# Patient Record
Sex: Male | Born: 1940 | Race: White | Hispanic: No | Marital: Married | Smoking: Former smoker
Health system: Southern US, Community
[De-identification: ages and names within clinical notes are randomized; demographics above are authoritative.]

---

## 2013-01-12 ENCOUNTER — Ambulatory Visit: Payer: Self-pay | Admitting: Family Medicine

## 2018-01-06 ENCOUNTER — Other Ambulatory Visit: Payer: Self-pay | Admitting: Family Medicine

## 2018-01-06 ENCOUNTER — Ambulatory Visit
Admission: RE | Admit: 2018-01-06 | Discharge: 2018-01-06 | Disposition: A | Discharge status: Home / Self Care | Payer: Medicare Other | Source: Ambulatory Visit | Attending: Family Medicine | Admitting: Family Medicine

## 2018-01-06 DIAGNOSIS — M7989 Other specified soft tissue disorders: Secondary | ICD-10-CM | POA: Insufficient documentation

## 2018-01-06 DIAGNOSIS — M79604 Pain in right leg: Secondary | ICD-10-CM

## 2019-02-01 ENCOUNTER — Inpatient Hospital Stay: Payer: Medicare Other

## 2019-02-01 ENCOUNTER — Emergency Department: Payer: Medicare Other

## 2019-02-01 DIAGNOSIS — I452 Bifascicular block: Secondary | ICD-10-CM | POA: Diagnosis present

## 2019-02-01 DIAGNOSIS — I480 Paroxysmal atrial fibrillation: Secondary | ICD-10-CM | POA: Diagnosis present

## 2019-02-01 DIAGNOSIS — I4901 Ventricular fibrillation: Secondary | ICD-10-CM | POA: Diagnosis not present

## 2019-02-01 DIAGNOSIS — J9602 Acute respiratory failure with hypercapnia: Secondary | ICD-10-CM | POA: Diagnosis present

## 2019-02-01 DIAGNOSIS — I5023 Acute on chronic systolic (congestive) heart failure: Secondary | ICD-10-CM | POA: Diagnosis present

## 2019-02-01 DIAGNOSIS — Z823 Family history of stroke: Secondary | ICD-10-CM

## 2019-02-01 DIAGNOSIS — G9341 Metabolic encephalopathy: Secondary | ICD-10-CM | POA: Diagnosis present

## 2019-02-01 DIAGNOSIS — N179 Acute kidney failure, unspecified: Secondary | ICD-10-CM | POA: Diagnosis present

## 2019-02-01 DIAGNOSIS — N183 Chronic kidney disease, stage 3 (moderate): Secondary | ICD-10-CM | POA: Diagnosis present

## 2019-02-01 DIAGNOSIS — I214 Non-ST elevation (NSTEMI) myocardial infarction: Secondary | ICD-10-CM | POA: Diagnosis present

## 2019-02-01 DIAGNOSIS — E0590 Thyrotoxicosis, unspecified without thyrotoxic crisis or storm: Secondary | ICD-10-CM | POA: Diagnosis present

## 2019-02-01 DIAGNOSIS — Z7901 Long term (current) use of anticoagulants: Secondary | ICD-10-CM | POA: Diagnosis not present

## 2019-02-01 DIAGNOSIS — I130 Hypertensive heart and chronic kidney disease with heart failure and stage 1 through stage 4 chronic kidney disease, or unspecified chronic kidney disease: Principal | ICD-10-CM | POA: Diagnosis present

## 2019-02-01 DIAGNOSIS — Z01818 Encounter for other preprocedural examination: Secondary | ICD-10-CM

## 2019-02-01 DIAGNOSIS — I5084 End stage heart failure: Secondary | ICD-10-CM | POA: Diagnosis present

## 2019-02-01 DIAGNOSIS — E1122 Type 2 diabetes mellitus with diabetic chronic kidney disease: Secondary | ICD-10-CM | POA: Diagnosis present

## 2019-02-01 DIAGNOSIS — J9601 Acute respiratory failure with hypoxia: Secondary | ICD-10-CM | POA: Diagnosis present

## 2019-02-01 DIAGNOSIS — E041 Nontoxic single thyroid nodule: Secondary | ICD-10-CM | POA: Diagnosis present

## 2019-02-01 DIAGNOSIS — Z66 Do not resuscitate: Secondary | ICD-10-CM | POA: Diagnosis not present

## 2019-02-01 DIAGNOSIS — E872 Acidosis: Secondary | ICD-10-CM | POA: Diagnosis present

## 2019-02-01 DIAGNOSIS — I255 Ischemic cardiomyopathy: Secondary | ICD-10-CM | POA: Diagnosis present

## 2019-02-01 DIAGNOSIS — Z20828 Contact with and (suspected) exposure to other viral communicable diseases: Secondary | ICD-10-CM | POA: Diagnosis present

## 2019-02-01 DIAGNOSIS — I361 Nonrheumatic tricuspid (valve) insufficiency: Secondary | ICD-10-CM | POA: Diagnosis not present

## 2019-02-01 DIAGNOSIS — R570 Cardiogenic shock: Secondary | ICD-10-CM | POA: Diagnosis present

## 2019-02-01 DIAGNOSIS — Z8546 Personal history of malignant neoplasm of prostate: Secondary | ICD-10-CM | POA: Diagnosis not present

## 2019-02-01 DIAGNOSIS — J9600 Acute respiratory failure, unspecified whether with hypoxia or hypercapnia: Secondary | ICD-10-CM

## 2019-02-01 DIAGNOSIS — Z79899 Other long term (current) drug therapy: Secondary | ICD-10-CM

## 2019-02-01 DIAGNOSIS — Z1159 Encounter for screening for other viral diseases: Secondary | ICD-10-CM | POA: Diagnosis not present

## 2019-02-01 DIAGNOSIS — K219 Gastro-esophageal reflux disease without esophagitis: Secondary | ICD-10-CM | POA: Diagnosis present

## 2019-02-01 DIAGNOSIS — I420 Dilated cardiomyopathy: Secondary | ICD-10-CM | POA: Diagnosis present

## 2019-02-01 DIAGNOSIS — E785 Hyperlipidemia, unspecified: Secondary | ICD-10-CM | POA: Diagnosis present

## 2019-02-01 DIAGNOSIS — R9401 Abnormal electroencephalogram [EEG]: Secondary | ICD-10-CM | POA: Diagnosis not present

## 2019-02-01 DIAGNOSIS — Z8249 Family history of ischemic heart disease and other diseases of the circulatory system: Secondary | ICD-10-CM

## 2019-02-01 DIAGNOSIS — I469 Cardiac arrest, cause unspecified: Secondary | ICD-10-CM | POA: Diagnosis present

## 2019-02-01 DIAGNOSIS — Z515 Encounter for palliative care: Secondary | ICD-10-CM | POA: Diagnosis not present

## 2019-02-01 DIAGNOSIS — I4819 Other persistent atrial fibrillation: Secondary | ICD-10-CM | POA: Diagnosis not present

## 2019-02-01 DIAGNOSIS — R0602 Shortness of breath: Secondary | ICD-10-CM

## 2019-02-01 DIAGNOSIS — Z87891 Personal history of nicotine dependence: Secondary | ICD-10-CM

## 2019-02-01 DIAGNOSIS — E1165 Type 2 diabetes mellitus with hyperglycemia: Secondary | ICD-10-CM | POA: Diagnosis not present

## 2019-02-01 DIAGNOSIS — Z0189 Encounter for other specified special examinations: Secondary | ICD-10-CM

## 2019-02-01 DIAGNOSIS — I272 Pulmonary hypertension, unspecified: Secondary | ICD-10-CM | POA: Diagnosis present

## 2019-02-01 DIAGNOSIS — Z452 Encounter for adjustment and management of vascular access device: Secondary | ICD-10-CM

## 2019-02-01 LAB — URINALYSIS, COMPLETE (UACMP) WITH MICROSCOPIC
Bacteria, UA: NONE SEEN
Bilirubin Urine: NEGATIVE
Glucose, UA: NEGATIVE mg/dL
Hgb urine dipstick: NEGATIVE
Ketones, ur: NEGATIVE mg/dL
Leukocytes,Ua: NEGATIVE
Nitrite: NEGATIVE
Protein, ur: 100 mg/dL — AB
Specific Gravity, Urine: 1.019 (ref 1.005–1.030)
pH: 5 (ref 5.0–8.0)

## 2019-02-01 LAB — CBC WITH DIFFERENTIAL/PLATELET
Abs Immature Granulocytes: 0.39 K/uL — ABNORMAL HIGH (ref 0.00–0.07)
Basophils Absolute: 0 K/uL (ref 0.0–0.1)
Basophils Relative: 0 %
Eosinophils Absolute: 0.1 K/uL (ref 0.0–0.5)
Eosinophils Relative: 1 %
HCT: 42.9 % (ref 39.0–52.0)
Hemoglobin: 13.5 g/dL (ref 13.0–17.0)
Immature Granulocytes: 3 %
Lymphocytes Relative: 30 %
Lymphs Abs: 3.9 K/uL (ref 0.7–4.0)
MCH: 27.9 pg (ref 26.0–34.0)
MCHC: 31.5 g/dL (ref 30.0–36.0)
MCV: 88.6 fL (ref 80.0–100.0)
Monocytes Absolute: 0.6 K/uL (ref 0.1–1.0)
Monocytes Relative: 5 %
Neutro Abs: 7.9 K/uL — ABNORMAL HIGH (ref 1.7–7.7)
Neutrophils Relative %: 61 %
Platelets: 233 K/uL (ref 150–400)
RBC: 4.84 MIL/uL (ref 4.22–5.81)
RDW: 13.9 % (ref 11.5–15.5)
WBC: 12.9 K/uL — ABNORMAL HIGH (ref 4.0–10.5)
nRBC: 0 % (ref 0.0–0.2)

## 2019-02-01 LAB — BLOOD GAS, ARTERIAL
Acid-base deficit: 6.2 mmol/L — ABNORMAL HIGH (ref 0.0–2.0)
Acid-base deficit: 8.3 mmol/L — ABNORMAL HIGH (ref 0.0–2.0)
Bicarbonate: 20.7 mmol/L (ref 20.0–28.0)
Bicarbonate: 16.2 mmol/L — ABNORMAL LOW (ref 20.0–28.0)
FIO2: 0.5
FIO2: 0.5
MECHVT: 500 mL
MECHVT: 500 mL
Mechanical Rate: 16
O2 Saturation: 97.5 %
O2 Saturation: 98.4 %
PEEP: 5 cmH2O
PEEP: 5 cmH2O
Patient temperature: 37
Patient temperature: 37
RATE: 16 resp/min
pCO2 arterial: 45 mmHg (ref 32.0–48.0)
pCO2 arterial: 30 mmHg — ABNORMAL LOW (ref 32.0–48.0)
pH, Arterial: 7.27 — ABNORMAL LOW (ref 7.350–7.450)
pH, Arterial: 7.34 — ABNORMAL LOW (ref 7.350–7.450)
pO2, Arterial: 109 mmHg — ABNORMAL HIGH (ref 83.0–108.0)
pO2, Arterial: 119 mmHg — ABNORMAL HIGH (ref 83.0–108.0)

## 2019-02-01 LAB — COMPREHENSIVE METABOLIC PANEL WITH GFR
ALT: 34 U/L (ref 0–44)
AST: 43 U/L — ABNORMAL HIGH (ref 15–41)
Albumin: 3 g/dL — ABNORMAL LOW (ref 3.5–5.0)
Alkaline Phosphatase: 89 U/L (ref 38–126)
Anion gap: 12 (ref 5–15)
BUN: 30 mg/dL — ABNORMAL HIGH (ref 8–23)
CO2: 18 mmol/L — ABNORMAL LOW (ref 22–32)
Calcium: 8.3 mg/dL — ABNORMAL LOW (ref 8.9–10.3)
Chloride: 108 mmol/L (ref 98–111)
Creatinine, Ser: 1.78 mg/dL — ABNORMAL HIGH (ref 0.61–1.24)
GFR calc Af Amer: 41 mL/min — ABNORMAL LOW
GFR calc non Af Amer: 36 mL/min — ABNORMAL LOW
Glucose, Bld: 208 mg/dL — ABNORMAL HIGH (ref 70–99)
Potassium: 3.8 mmol/L (ref 3.5–5.1)
Sodium: 138 mmol/L (ref 135–145)
Total Bilirubin: 1.1 mg/dL (ref 0.3–1.2)
Total Protein: 5.7 g/dL — ABNORMAL LOW (ref 6.5–8.1)

## 2019-02-01 LAB — URINE DRUG SCREEN, QUALITATIVE (ARMC ONLY)
Amphetamines, Ur Screen: NOT DETECTED
Barbiturates, Ur Screen: NOT DETECTED
Benzodiazepine, Ur Scrn: NOT DETECTED
Cannabinoid 50 Ng, Ur ~~LOC~~: NOT DETECTED
Cocaine Metabolite,Ur ~~LOC~~: NOT DETECTED
MDMA (Ecstasy)Ur Screen: NOT DETECTED
Methadone Scn, Ur: NOT DETECTED
Opiate, Ur Screen: NOT DETECTED
Phencyclidine (PCP) Ur S: NOT DETECTED
Tricyclic, Ur Screen: NOT DETECTED

## 2019-02-01 LAB — APTT
aPTT: 32 seconds (ref 24–36)
aPTT: 32 seconds (ref 24–36)

## 2019-02-01 LAB — TROPONIN I
Troponin I: 0.09 ng/mL (ref <0.03)
Troponin I: 0.22 ng/mL (ref <0.03)
Troponin I: 0.3 ng/mL (ref <0.03)

## 2019-02-01 LAB — GLUCOSE, CAPILLARY
Glucose-Capillary: 178 mg/dL — ABNORMAL HIGH (ref 70–99)
Glucose-Capillary: 178 mg/dL — ABNORMAL HIGH (ref 70–99)

## 2019-02-01 LAB — PROTIME-INR
INR: 1.5 — ABNORMAL HIGH (ref 0.8–1.2)
INR: 1.3 — ABNORMAL HIGH (ref 0.8–1.2)
Prothrombin Time: 18.2 s — ABNORMAL HIGH (ref 11.4–15.2)
Prothrombin Time: 16.1 seconds — ABNORMAL HIGH (ref 11.4–15.2)

## 2019-02-01 LAB — SARS CORONAVIRUS 2 BY RT PCR (HOSPITAL ORDER, PERFORMED IN ~~LOC~~ HOSPITAL LAB): SARS Coronavirus 2: NEGATIVE

## 2019-02-01 LAB — LACTIC ACID, PLASMA: Lactic Acid, Venous: 2.6 mmol/L (ref 0.5–1.9)

## 2019-02-01 LAB — PROCALCITONIN: Procalcitonin: 0.38 ng/mL

## 2019-02-01 MED ORDER — IPRATROPIUM-ALBUTEROL 0.5-2.5 (3) MG/3ML IN SOLN: 3.0000 mL | RESPIRATORY_TRACT | Status: DC | PRN

## 2019-02-01 MED ORDER — PANTOPRAZOLE SODIUM 40 MG IV SOLR: 40.0000 mg | Freq: Every day | INTRAVENOUS | Status: DC

## 2019-02-01 MED ORDER — ASPIRIN 300 MG RE SUPP
300.0000 mg | RECTAL | Status: AC
  Administered 2019-02-01: 300 mg via RECTAL
  Filled 2019-02-01: qty 1

## 2019-02-01 MED ORDER — SODIUM CHLORIDE 0.9 % IV BOLUS
1000.0000 mL | Freq: Once | INTRAVENOUS | Status: AC
  Administered 2019-02-01: 1000 mL via INTRAVENOUS

## 2019-02-01 MED ORDER — NOREPINEPHRINE 16 MG/250ML-% IV SOLN
0.0000 ug/min | INTRAVENOUS | Status: DC
  Administered 2019-02-02: 7 ug/min via INTRAVENOUS
  Administered 2019-02-02: 10 ug/min via INTRAVENOUS
  Filled 2019-02-01 (×2): qty 250

## 2019-02-01 MED ORDER — PROPOFOL 1000 MG/100ML IV EMUL
25.0000 ug/kg/min | INTRAVENOUS | Status: DC
  Administered 2019-02-01 – 2019-02-03 (×4): 25 ug/kg/min via INTRAVENOUS
  Administered 2019-02-03: 20 ug/kg/min via INTRAVENOUS
  Filled 2019-02-01 (×6): qty 100

## 2019-02-01 MED ORDER — PROPOFOL 1000 MG/100ML IV EMUL
INTRAVENOUS | Status: AC
  Administered 2019-02-01: 25 ug/kg/min via INTRAVENOUS
  Filled 2019-02-01: qty 100

## 2019-02-01 MED ORDER — FENTANYL 2500MCG IN NS 250ML (10MCG/ML) PREMIX INFUSION
100.0000 ug/h | INTRAVENOUS | Status: DC
  Administered 2019-02-01 – 2019-02-02 (×2): 100 ug/h via INTRAVENOUS
  Administered 2019-02-03: 150 ug/h via INTRAVENOUS
  Administered 2019-02-04: 05:00:00 200 ug/h via INTRAVENOUS
  Filled 2019-02-01 (×3): qty 250

## 2019-02-01 MED ORDER — CHLORHEXIDINE GLUCONATE CLOTH 2 % EX PADS
6.0000 | MEDICATED_PAD | Freq: Every day | CUTANEOUS | Status: DC
  Administered 2019-02-02 – 2019-02-04 (×3): 6 via TOPICAL

## 2019-02-01 MED ORDER — FENTANYL 2500MCG IN NS 250ML (10MCG/ML) PREMIX INFUSION
INTRAVENOUS | Status: AC
  Administered 2019-02-01: 100 ug/h via INTRAVENOUS
  Filled 2019-02-01: qty 250

## 2019-02-01 MED ORDER — SODIUM CHLORIDE 0.9 % IV SOLN
INTRAVENOUS | Status: DC
  Administered 2019-02-01: 21:00:00 via INTRAVENOUS

## 2019-02-01 MED ORDER — PANTOPRAZOLE SODIUM 40 MG IV SOLR
40.0000 mg | Freq: Every day | INTRAVENOUS | Status: DC
  Administered 2019-02-01 – 2019-02-03 (×3): 40 mg via INTRAVENOUS
  Filled 2019-02-01 (×3): qty 40

## 2019-02-01 MED ORDER — FENTANYL BOLUS VIA INFUSION
25.0000 ug | INTRAVENOUS | Status: DC | PRN
  Administered 2019-02-01: 25 ug via INTRAVENOUS
  Filled 2019-02-01: qty 25

## 2019-02-01 MED ORDER — SODIUM BICARBONATE 8.4 % IV SOLN
50.0000 meq | Freq: Once | INTRAVENOUS | Status: AC
  Administered 2019-02-01: 50 meq via INTRAVENOUS
  Filled 2019-02-01: qty 50

## 2019-02-01 MED ORDER — ROCURONIUM BROMIDE 50 MG/5ML IV SOLN
100.0000 mg | Freq: Once | INTRAVENOUS | Status: AC
  Administered 2019-02-01: 100 mg via INTRAVENOUS
  Filled 2019-02-01: qty 10

## 2019-02-01 MED ORDER — NOREPINEPHRINE 4 MG/250ML-% IV SOLN
0.0000 ug/min | INTRAVENOUS | Status: DC
  Administered 2019-02-01: 5 ug/min via INTRAVENOUS
  Filled 2019-02-01: qty 250

## 2019-02-01 MED ORDER — FENTANYL CITRATE (PF) 100 MCG/2ML IJ SOLN: 50.0000 ug | Freq: Once | INTRAMUSCULAR | Status: AC

## 2019-02-01 MED ORDER — INSULIN ASPART 100 UNIT/ML ~~LOC~~ SOLN
0.0000 [IU] | SUBCUTANEOUS | Status: DC
  Administered 2019-02-01: 3 [IU] via SUBCUTANEOUS
  Administered 2019-02-02: 2 [IU] via SUBCUTANEOUS
  Administered 2019-02-02: 3 [IU] via SUBCUTANEOUS
  Administered 2019-02-03 – 2019-02-04 (×3): 2 [IU] via SUBCUTANEOUS
  Filled 2019-02-01 (×3): qty 1

## 2019-02-01 MED ORDER — HEPARIN SODIUM (PORCINE) 5000 UNIT/ML IJ SOLN
5000.0000 [IU] | Freq: Three times a day (TID) | INTRAMUSCULAR | Status: DC
  Administered 2019-02-01 – 2019-02-02 (×3): 5000 [IU] via SUBCUTANEOUS
  Filled 2019-02-01 (×3): qty 1

## 2019-02-01 NOTE — H&P (Addendum)
[facility] - [place] at [facility]   PATIENT NAME: Tracy Fitzpatrick    MR#:  [id number]  DATE OF BIRTH:  1941/06/21  DATE OF ADMISSION:  02/14/2019  PRIMARY CARE PHYSICIAN: Rayetta HumphreyGeorge, Sionne A, MD   REQUESTING/REFERRING PHYSICIAN: Ileana RoupMcshane, James, MD  CHIEF COMPLAINT:   Chief Complaint  Patient presents with  . Loss of Consciousness    HISTORY OF PRESENT ILLNESS:  Tracy Hookehl Gut  is a 78 y.o. male with a known history of atrial fibrillation on Eliquis, CKD stage III, hypertension, hyperlipidemia, GERD, arthritis, prediabetes, prostate cancer and CHF presenting to the ED post witnessed cardiac arrest.  Patient was recently admitted at [facility] with several days of increasing shortness of breath sleep at night and difficulty lying supine.  He was found to be in atrial fibrillation with RVR thought to be due to hyperthyroidism with probable impending storm. Failed TEE/cardioversion on 5/21.  Patient's wife is currently at the bedside, patient has been having increased dyspnea and feeling short of breath especially with lying down since being discharged from the hospital on the 26th.  Patient's wife states he has been sleeping on the recliner every night.  Last night he was noted to be confused and unable to find his way to the bathroom.  Wife report that he woke up this morning and was able to go to his PCP appointment without any problem.  Patient told his wife he was not feeling well and went to sit in the recliner where he became unresponsive.  Patient wife called EMS and attempted CPR while waiting.  On EMS arrival patient was found to be in V. fib arrest they gave him 1 shock, AP x2 with ROCS.He was intubated at the scene and brought to the ED.  On arrival to the ED, blood pressure was 88/4277mm per Hg, pulse rate 133, respiration 33.  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  ABGs shows pH of 7.27, PCO2 45, FiO2 0.5, PO2 109,  bicarb 20.7, WBC 12.9, BUN 30, creatinine 1.78, AST 43, troponin 0.09, SARS Coronavirus negative.  X-ray showed mild cardiac enlargement but no significant pulmonary finding.  Hospitalists were called for admission to the ICU for further monitoring and management.   PAST MEDICAL HISTORY:  History reviewed. No pertinent past medical history.  PAST SURGICAL HISTORY:  History reviewed. No pertinent surgical history.  SOCIAL HISTORY:   Social History   Tobacco Use  . Smoking status: Not on file  Substance Use Topics  . Alcohol use: Not on file    FAMILY HISTORY:  History reviewed. No pertinent family history. Medical History Relation Name Comments  Kidney failure Brother    Osteoarthritis Brother    Coronary Artery Disease (Blocked arteries around heart) Father    High blood pressure (Hypertension) Mother    Hyperlipidemia (Elevated cholesterol) Mother    Stroke Mother    Uterine cancer Mother    Breast cancer Neg Hx    Prostate cancer Neg Hx      DRUG ALLERGIES:   Allergies  Allergen Reactions  . Penicillins Other (See Comments)    Other Reaction: fainting, Other reaction  . Latex Rash    REVIEW OF SYSTEMS:   ROS Unable to obtain from patient as patient is currently intubated  MEDICATIONS AT HOME:   Prior to Admission medications   Medication Sig Start Date End Date Taking? Authorizing Provider  apixaban (ELIQUIS) 5 MG TABS tablet Take  5 mg by mouth 2 (two) times a day. 01/25/19 02/24/19 Yes [provider]  benazepril (LOTENSIN) 20 MG tablet Take 20 mg by mouth daily. 12/17/18  Yes [provider]  dofetilide (TIKOSYN) 250 MCG capsule Take 250 mcg by mouth every 12 (twelve) hours. 01/25/19 01/25/20 Yes [provider]  dorzolamide-timolol (COSOPT) 22.3-6.8 MG/ML ophthalmic solution Place 1 drop into both eyes 2 (two) times daily.   Yes [provider]  finasteride (PROSCAR) 5 MG tablet Take 5 mg by mouth daily.  11/11/18  Yes [provider]  latanoprost (XALATAN) 0.005 % ophthalmic solution Place 1 drop into both eyes at bedtime.   Yes [provider]  methimazole (TAPAZOLE) 10 MG tablet Take 20 mg by mouth 2 (two) times a day. 01/25/19 01/25/20 Yes [provider]  metoprolol succinate (TOPROL-XL) 100 MG 24 hr tablet Take 100 mg by mouth daily. 01/25/19 01/25/20 Yes [provider]  pravastatin (PRAVACHOL) 10 MG tablet TAKE 1 TABLET BY MOUTH EVERY DAY AT NIGHT 12/17/18  Yes [provider]      VITAL SIGNS:  Blood pressure 105/84, pulse 73, temperature 98.5 F (36.9 C), resp. rate (!) 24, SpO2 99 %.  PHYSICAL EXAMINATION:   Physical Exam  GENERAL:  78 y.o.-year-old patient lying in the bed with no acute distress.  EYES: Pupils equal, round, reactive to light and accommodation. No scleral icterus. Extraocular muscles intact.  HEENT: Head atraumatic, normocephalic. Oropharynx and nasopharynx clear.  NECK:  Supple, no jugular venous distention. No thyroid enlargement, no tenderness.  LUNGS: Normal breath sounds bilaterally, no wheezing, rales,rhonchi or crepitation. No use of accessory muscles of respiration.  CARDIOVASCULAR: S1, S2 normal. No murmurs, rubs, or gallops.  ABDOMEN: Soft, nontender, nondistended. Bowel sounds present. No organomegaly or mass.  EXTREMITIES: No pedal edema, cyanosis, or clubbing.  NEUROLOGIC: Patient is intubated. Mental Status: Patient does not respond to verbal stimuli.  Does not respond to deep sternal rub.  Does not follow commands.  No verbalizations noted.  Cranial Nerves: II: patient does not respond confrontation bilaterally, pupils right 3 mm, left 3 mm,and NONREACTIVE bilaterally III,IV,VI: doll's response absent bilaterally.  V,VII: corneal reflex present bilaterally  VIII: patient does not respond to verbal stimuli IX,X: gag reflex absent, XI: trapezius strength unable to test bilaterally XII: tongue strength  unable to test Motor: Extremities flaccid throughout.  No spontaneous movement noted.  No purposeful movements noted. Sensory: Does not respond to noxious stimuli in any extremity. Deep Tendon Reflexes:  Absent throughout. Plantars: absent bilaterally Cerebellar: Unable to perform PSYCHIATRIC:   SKIN: No obvious rash, lesion, or ulcer.   DATA REVIEWED:  LABORATORY PANEL:   CBC Recent Labs  Lab 03/03/2019 1743  WBC 12.9*  HGB 13.5  HCT 42.9  PLT 233   ------------------------------------------------------------------------------------------------------------------  Chemistries  Recent Labs  Lab Mar 03, 2019 1743  NA 138  K 3.8  CL 108  CO2 18*  GLUCOSE 208*  BUN 30*  CREATININE 1.78*  CALCIUM 8.3*  AST 43*  ALT 34  ALKPHOS 89  BILITOT 1.1   ------------------------------------------------------------------------------------------------------------------  Cardiac Enzymes Recent Labs  Lab 03-03-2019 1743  TROPONINI 0.09*   ------------------------------------------------------------------------------------------------------------------  RADIOLOGY:  Dg Abdomen 1 View  Result Date: 2019-03-03 CLINICAL DATA:  NG tube placement. EXAM: ABDOMEN - 1 VIEW COMPARISON:  None. FINDINGS: External pacer paddles are noted. The NG tube tip is in the midesophagus and needs to be advanced several cm. Scattered air in the small bowel and colon. No obvious free air.  IMPRESSION: The NG tube is in the mid esophagus and should be advanced several cm. Electronically Signed   By: Rudie Meyer M.D.   On: 02/24/2019 18:40   Dg Chest Portable 1 View  Result Date: 02/07/2019 CLINICAL DATA:  Intubation. EXAM: PORTABLE CHEST 1 VIEW COMPARISON:  None. FINDINGS: The heart is mildly enlarged. The mediastinal and hilar contours are within normal limits. The endotracheal tube is 5.3 cm above the carina. The NG tube is in the mid esophagus and needs to be advanced several cm. The lungs are clear of  acute process. No infiltrates or effusions. No pneumothorax. The bony thorax is intact. IMPRESSION: Mild cardiac enlargement but no significant pulmonary findings. The ET tube is 5.3 cm above the carina. The NG tube is in the mid esophagus and should be advanced several cm. Electronically Signed   By: Rudie Meyer M.D.   On: 02/27/2019 18:42    EKG:  EKG: sinus tachycardia, RBBB. Vent. rate 131 BPM PR interval * ms QRS duration 132 ms QT/QTc 351/519 ms P-R-T axes -56 267 58  IMPRESSION AND PLAN:   78 y.o. male with a known history of atrial fibrillation on Eliquis, CKD stage III, hypertension, hyperlipidemia, GERD, arthritis, prediabetes, prostate cancer and CHF presenting to the ED post witnessed cardiac arrest.  1.  Cardiac arrest  - patient presents with witnessed V. fib arrest post ACLS with ROSC  - Admit to ICU - Mechanical ventilation - Obtain CT head - Management per ICU team  2. Elevated troponins - Status post cardiac arrest and ACLS - Trending troponin   3. Systolic Congestive Heart Failure   Last Echo 01/18/19 EF 15% - Repeat Echo post cardiac arrest - On metoprolol and Lotensin held for low BP  3. 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 Atrial fibrillation -anticoagulated on Eliquis - Holding Eliquis pending CT head.  Restart if CT head shows no bleed  5. Hypertension -now hypotensive in the setting of cardiac arrest - Holding all home BP meds  6. CKD (chronic kidney disease) stage 3, GFR 30-59 ml/min  - Hold nephrotoxins -Follow labs in am  All the records are reviewed and case discussed with ED provider. Management plans discussed with the patient, family and they are in agreement.  CODE STATUS: FULL  TOTAL TIME TAKING CARE OF THIS PATIENT: 50 minutes.    on 05-Feb-2019 at 7:44 PM  This patient was staffed with Dr. Orpha Bur, [facility] who personally  evaluated patient, reviewed documentation and agreed with assessment and plan of care as above.  Webb Silversmith, DNP, FNP-BC [facility] Nurse Practitioner Between 7am to 6pm - Pager [phone]  After 6pm go to [email or web address] - password [email or web address]  [facility] [place] [facility]  Office  [phone]  CC: Primary care physician; Rayetta Humphrey, MD

## 2019-02-01 NOTE — ED Notes (Signed)
X-ray at bedside

## 2019-02-01 NOTE — ED Triage Notes (Addendum)
Pt here via EMS from home after witnessed cardiac arrest while sitting on the porch. Per Ems family stated pt gasped and then lost consciousness. Pt brought in with king airway. Found in V-fib when EMS arrived, shocked x 1, 2mg  epi total with EMS. Total down time 5 mins. 18 gauge left EJ by EMS, 18 gauge left AC by EMS. 1000 NS bolus given by EMS.

## 2019-02-01 NOTE — ED Notes (Signed)
Pt assessed, family present. Pt calm on vent. Pulses present.

## 2019-02-01 NOTE — Procedures (Signed)
Central Venous Catheter Insertion Procedure Note Tracy Fitzpatrick [id number] 07-05-1941  Procedure: Insertion of Central Venous Catheter Indications: Assessment of intravascular volume, Drug and/or fluid administration and Frequent blood sampling  Procedure Details Consent: Risks of procedure as well as the alternatives and risks of each were explained to the (patient/caregiver).  Consent for procedure obtained. Time Out: Verified patient identification, verified procedure, site/side was marked, verified correct patient position, special equipment/implants available, medications/allergies/relevent history reviewed, required imaging and test results available.  Performed  Maximum sterile technique was used including antiseptics, cap, gloves, gown, hand hygiene, mask and sheet. Skin prep: Chlorhexidine; local anesthetic administered A antimicrobial bonded/coated triple lumen catheter was placed in the right internal jugular vein using the Seldinger technique.  Evaluation Blood flow good Complications: No apparent complications Patient did tolerate procedure well. Chest X-ray ordered to verify placement.  CXR: pending.  Ultrasound was used for direct visualization of cannulization of Right IJ. Line was secured at the 17 cm mark.     Harlon Ditty, AGACNP-BC [facility] Pager: [phone] Cell: [phone]  Judithe Modest 02-08-19, 10:33 PM

## 2019-02-01 NOTE — Procedures (Signed)
Arterial Catheter Insertion Procedure Note Tracy Fitzpatrick [id number] 08-03-1941  Procedure: Insertion of Arterial Catheter  Indications: Blood pressure monitoring and Frequent blood sampling  Procedure Details Consent: Risks of procedure as well as the alternatives and risks of each were explained to the (patient/caregiver).  Consent for procedure obtained. Time Out: Verified patient identification, verified procedure, site/side was marked, verified correct patient position, special equipment/implants available, medications/allergies/relevent history reviewed, required imaging and test results available.  Performed  Maximum sterile technique was used including antiseptics, cap, gloves, gown, hand hygiene, mask and sheet. Skin prep: Chlorhexidine; local anesthetic administered 20 gauge catheter was inserted into left femoral artery using the Seldinger technique. ULTRASOUND GUIDANCE USED: YES Evaluation Blood flow good; BP tracing good. Complications: No apparent complications.   Harlon Ditty, AGACNP-BC [facility] Pager: [phone] Cell: [phone]  Judithe Modest 02/14/19

## 2019-02-01 NOTE — ED Notes (Signed)
ED TO INPATIENT HANDOFF REPORT  ED Nurse Name and Phone #: Berline Lopes [phone]  S Name/Age/Gender Tracy Fitzpatrick 78 y.o. male Room/Bed: ED10A/ED10A  Code Status   Code Status: Full Code  Home/SNF/Other Home Is this baseline?   Triage Complete: Triage complete  Chief Complaint Respiratory  Triage Note Pt here via EMS from home after witnessed cardiac arrest while sitting on the porch. Per Ems family stated pt gasped and then lost consciousness. Pt brought in with king airway. Found in V-fib when EMS arrived, shocked x 1, 2mg  epi total with EMS. Total down time 5 mins. 18 gauge left EJ by EMS, 18 gauge left AC by EMS. 1000 NS bolus given by EMS.    Allergies Allergies  Allergen Reactions  . Penicillins Other (See Comments)    Other Reaction: fainting, Other reaction  . Latex Rash    Level of Care/Admitting Diagnosis ED Disposition    ED Disposition Condition Comment   Admit  Hospital Area: [facility] [[id number]]  Level of Care: ICU [6]  Covid Evaluation: Person Under Investigation (PUI)  Isolation Risk Level: Low Risk/Droplet (Less than 4L [place] supplementation)  Diagnosis: Cardiac arrest (HCC) [427.5.ICD-9-CM]  Admitting Physician: Webb Silversmith [facility] [[id number]]  Attending Physician: Willadean Carol DODD [[id number]]  Estimated length of stay: past midnight tomorrow  Certification:: I certify this patient will need inpatient services for at least 2 midnights  PT Class (Do Not Modify): Inpatient [101]  PT Acc Code (Do Not Modify): Private [1]       B Medical/Surgery History History reviewed. No pertinent past medical history. History reviewed. No pertinent surgical history.   A IV Location/Drains/Wounds Patient Lines/Drains/Airways Status   Active Line/Drains/Airways    Name:   Placement date:   Placement time:   Site:   Days:   Peripheral IV 02/14/2019 Left External jugular   02-14-2019    1728    External jugular   less than 1   Peripheral IV 2019-02-14  Right Antecubital   2019/02/14    1750    Antecubital   less than 1   Peripheral IV Feb 14, 2019 Left Antecubital   02/14/2019    1731    Antecubital   less than 1   NG/OG Tube Orogastric 18 Fr. Center mouth Aucultation;Xray   02/14/2019    1753    Center mouth   less than 1   Urethral Catheter vet Double-lumen   2019/02/14    1752    Double-lumen   less than 1   Airway 7.5 mm   February 14, 2019    1745     less than 1          Intake/Output Last 24 hours No intake or output data in the 24 hours ending February 14, 2019 1928  Labs/Imaging Results for orders placed or performed during the hospital encounter of 14-Feb-2019 (from the past 48 hour(s))  Glucose, capillary     Status: Abnormal   Collection Time: 2019-02-14  5:42 PM  Result Value Ref Range   Glucose-Capillary 178 (H) 70 - 99 mg/dL  CBC with Differential     Status: Abnormal   Collection Time: 02/14/2019  5:43 PM  Result Value Ref Range   WBC 12.9 (H) 4.0 - 10.5 K/uL   RBC 4.84 4.22 - 5.81 MIL/uL   Hemoglobin 13.5 13.0 - 17.0 g/dL   HCT 93.5 52.1 - 74.7 %   MCV 88.6 80.0 - 100.0 fL   MCH 27.9 26.0 - 34.0 pg   MCHC  31.5 30.0 - 36.0 g/dL   RDW 16.113.9 09.611.5 - 04.515.5 %   Platelets 233 150 - 400 K/uL   nRBC 0.0 0.0 - 0.2 %   Neutrophils Relative % 61 %   Neutro Abs 7.9 (H) 1.7 - 7.7 K/uL   Lymphocytes Relative 30 %   Lymphs Abs 3.9 0.7 - 4.0 K/uL   Monocytes Relative 5 %   Monocytes Absolute 0.6 0.1 - 1.0 K/uL   Eosinophils Relative 1 %   Eosinophils Absolute 0.1 0.0 - 0.5 K/uL   Basophils Relative 0 %   Basophils Absolute 0.0 0.0 - 0.1 K/uL   Immature Granulocytes 3 %   Abs Immature Granulocytes 0.39 (H) 0.00 - 0.07 K/uL    Comment: Performed at [facility], [address] [place], [place] [address]  Comprehensive metabolic panel     Status: Abnormal   Collection Time: 04/23/2019  5:43 PM  Result Value Ref Range   Sodium 138 135 - 145 mmol/L   Potassium 3.8 3.5 - 5.1 mmol/L   Chloride 108 98 - 111 mmol/L   CO2 18 (L) 22 - 32 mmol/L    Glucose, Bld 208 (H) 70 - 99 mg/dL   BUN 30 (H) 8 - 23 mg/dL   Creatinine, Ser 1.911.78 (H) 0.61 - 1.24 mg/dL   Calcium 8.3 (L) 8.9 - 10.3 mg/dL   Total Protein 5.7 (L) 6.5 - 8.1 g/dL   Albumin 3.0 (L) 3.5 - 5.0 g/dL   AST 43 (H) 15 - 41 U/L   ALT 34 0 - 44 U/L   Alkaline Phosphatase 89 38 - 126 U/L   Total Bilirubin 1.1 0.3 - 1.2 mg/dL   GFR calc non Af Amer 36 (L) >60 mL/min   GFR calc Af Amer 41 (L) >60 mL/min   Anion gap 12 5 - 15    Comment: Performed at [facility], [address] [place], [place] [address]  Troponin I - ONCE - STAT     Status: Abnormal   Collection Time: 04/23/2019  5:43 PM  Result Value Ref Range   Troponin I 0.09 (HH) <0.03 ng/mL    Comment: CRITICAL RESULT CALLED TO, READ BACK BY AND VERIFIED WITH VAL CHANDLER @1857  02/11/2019 MJU Performed at [facility], [address] [place], [place] [address]   SARS Coronavirus 2 (CEPHEID - Performed in [facility] hospital lab), Hosp Order     Status: None   Collection Time: 04/23/2019  5:58 PM  Result Value Ref Range   SARS Coronavirus 2 NEGATIVE NEGATIVE    Comment: (NOTE) If result is NEGATIVE SARS-CoV-2 target nucleic acids are NOT DETECTED. The SARS-CoV-2 RNA is generally detectable in upper and lower  respiratory specimens during the acute phase of infection. The lowest  concentration of SARS-CoV-2 viral copies this assay can detect is 250  copies / mL. A negative result does not preclude SARS-CoV-2 infection  and should not be used as the sole basis for treatment or other  patient management decisions.  A negative result may occur with  improper specimen collection / handling, submission of specimen other  than nasopharyngeal swab, presence of viral mutation(s) within the  areas targeted by this assay, and inadequate number of viral copies  (<250 copies / mL). A negative result must be combined with clinical  observations, patient history, and epidemiological information. If result is  POSITIVE SARS-CoV-2 target nucleic acids are DETECTED. The SARS-CoV-2 RNA is generally detectable in upper and lower  respiratory  specimens dur ing the acute phase of infection.  Positive  results are indicative of active infection with SARS-CoV-2.  Clinical  correlation with patient history and other diagnostic information is  necessary to determine patient infection status.  Positive results do  not rule out bacterial infection or co-infection with other viruses. If result is PRESUMPTIVE POSTIVE SARS-CoV-2 nucleic acids MAY BE PRESENT.   A presumptive positive result was obtained on the submitted specimen  and confirmed on repeat testing.  While 2019 novel coronavirus  (SARS-CoV-2) nucleic acids may be present in the submitted sample  additional confirmatory testing may be necessary for epidemiological  and / or clinical management purposes  to differentiate between  SARS-CoV-2 and other Sarbecovirus currently known to infect humans.  If clinically indicated additional testing with an alternate test  methodology [phone]) is advised. The SARS-CoV-2 RNA is generally  detectable in upper and lower respiratory sp ecimens during the acute  phase of infection. The expected result is Negative. Fact Sheet for Patients:  BoilerBrush.com.cy Fact Sheet for Healthcare Providers: https://pope.com/ This test is not yet approved or cleared by the [place] FDA and has been authorized for detection and/or diagnosis of SARS-CoV-2 by FDA under an Emergency Use Authorization (EUA).  This EUA will remain in effect (meaning this test can be used) for the duration of the COVID-19 declaration under Section 564(b)(1) of the Act, 21 U.S.C. section 360bbb-3(b)(1), unless the authorization is terminated or revoked sooner. Performed at [facility], [address] [place], [place] [address]   Protime-INR     Status: Abnormal   Collection Time:  19-Feb-2019  6:30 PM  Result Value Ref Range   Prothrombin Time 18.2 (H) 11.4 - 15.2 seconds   INR 1.5 (H) 0.8 - 1.2    Comment: (NOTE) INR goal varies based on device and disease states. Performed at [facility], [address] [place], [place] [address]   APTT     Status: None   Collection Time: 2019-02-19  6:30 PM  Result Value Ref Range   aPTT 32 24 - 36 seconds    Comment: Performed at [facility], [address] [place], [place] [address]  Blood gas, arterial     Status: Abnormal   Collection Time: Feb 19, 2019  7:20 PM  Result Value Ref Range   FIO2 0.50    Delivery systems VENTILATOR    Mode ASSIST CONTROL    VT 500 mL   Peep/cpap 5.0 cm H20   pH, Arterial 7.27 (L) 7.350 - 7.450   pCO2 arterial 45 32.0 - 48.0 mmHg   pO2, Arterial 109 (H) 83.0 - 108.0 mmHg   Bicarbonate 20.7 20.0 - 28.0 mmol/L   Acid-base deficit 6.2 (H) 0.0 - 2.0 mmol/L   O2 Saturation 97.5 %   Patient temperature 37.0    Collection site RIGHT RADIAL    Sample type ARTERIAL DRAW    Allens test (pass/fail) PASS PASS   Mechanical Rate 16     Comment: Performed at [facility], [address] [place], [place] [address]   Dg Abdomen 1 View  Result Date: 02-19-19 CLINICAL DATA:  NG tube placement. EXAM: ABDOMEN - 1 VIEW COMPARISON:  None. FINDINGS: External pacer paddles are noted. The NG tube tip is in the midesophagus and needs to be advanced several cm. Scattered air in the small bowel and colon. No obvious free air. IMPRESSION: The NG tube is in the mid esophagus and should be advanced several cm. Electronically  Signed   By: Rudie Meyer M.D.   On: 02/14/2019 18:40   Dg Chest Portable 1 View  Result Date: 01/31/2019 CLINICAL DATA:  Intubation. EXAM: PORTABLE CHEST 1 VIEW COMPARISON:  None. FINDINGS: The heart is mildly enlarged. The mediastinal and hilar contours are within normal limits. The endotracheal tube is 5.3 cm above the carina. The NG tube is in the mid esophagus  and needs to be advanced several cm. The lungs are clear of acute process. No infiltrates or effusions. No pneumothorax. The bony thorax is intact. IMPRESSION: Mild cardiac enlargement but no significant pulmonary findings. The ET tube is 5.3 cm above the carina. The NG tube is in the mid esophagus and should be advanced several cm. Electronically Signed   By: Rudie Meyer M.D.   On: 02/10/2019 18:42    Pending Labs Unresulted Labs (From admission, onward)    Start     Ordered   02/26/2019 1904  Troponin I - Now Then Q6H  Now then every 6 hours,   STAT     02/06/2019 1903   02/11/2019 1818  Thyroid Panel With TSH  Add-on,   AD     02/11/2019 1817          Vitals/Pain Today's Vitals   02/02/2019 1818 02/20/2019 1830 02/16/2019 1845 02/04/2019 1900  BP:  (!) 84/69  105/84  Pulse:  63 63 73  Resp:  (!) 21 (!) 21 (!) 24  Temp:  98.5 F (36.9 C) 98.4 F (36.9 C) 98.5 F (36.9 C)  TempSrc:  Core (Comment)    SpO2: 96% 96% 97% 99%    Isolation Precautions No active isolations  Medications Medications  sodium chloride 0.9 % bolus 1,000 mL (0 mLs Intravenous Stopped 02/04/2019 1755)  rocuronium (ZEMURON) injection 100 mg (100 mg Intravenous Given 02/08/2019 1745)    Mobility non-ambulatory High fall risk   Focused Assessments Neuro Assessment Handoff:  Swallow screen pass?  Cardiac Rhythm: Bundle branch block, Normal sinus rhythm       Neuro Assessment: Exceptions to WDL Neuro Checks:      Last Documented NIHSS Modified Score:   Has TPA been given? No If patient is a Neuro Trauma and patient is going to OR before floor call report to 4N Charge nurse: [phone] or [phone]     R Recommendations: See Admitting Provider Note  Report given to:   Additional Notes:

## 2019-02-01 NOTE — Progress Notes (Signed)
Updated pt's wife and his daughter Lurena Joiner that pt remains unresponsive on arrival to ICU and that CT head is negative.  Discussed with them given his continued unresponsiveness, would like to place him on hypothermia protocol, of which they are in agreement with.  They also give consent for Central Line and Arterial Line if needed for further treatment.    Harlon Ditty, AGACNP-BC [facility] Pager: [phone] Cell: [phone]

## 2019-02-01 NOTE — Progress Notes (Signed)
ETT was advanced 2cm per MD order. ETT is now secured 29cm at the lip. BBS auscultated.

## 2019-02-01 NOTE — Progress Notes (Signed)
Family Meeting Note  Advance Directive:no  Today a meeting took place with the family.  Patient is unable to participate due EX:NTZGYF capacity Sedated   The following clinical team members were present during this meeting:MD  The following were discussed:Patient's diagnosis: cardiac arrest, Patient's progosis: Unable to determine and Goals for treatment: Full Code  Additional follow-up to be provided: prn  Time spent during discussion:20 minutes  Hilton Sinclair, MD

## 2019-02-01 NOTE — Progress Notes (Signed)
eLink Physician-Brief Progress Note Patient Name: NICKOLAOS BOGARD DOB: 1941-02-02 MRN: [id number]   Date of Service  2019-02-22  HPI/Events of Note  78 y.o. male with a known history of atrial fibrillation on Eliquis, CKD stage III, hypertension, hyperlipidemia, GERD, arthritis, prediabetes, prostate cancer and CHF presenting to the ED post witnessed cardiac arrest. Now intubated and ventilated and on a Norepinephrine IV infusion. PCCM asked to assume care in ICU. VSS.  eICU Interventions  No new orders.      Intervention Category Evaluation Type: New Patient Evaluation  Lenell Antu 02/22/19, 9:31 PM

## 2019-02-01 NOTE — ED Notes (Signed)
Pt remains to have palpable radial pulses

## 2019-02-01 NOTE — ED Notes (Signed)
ETT advanced 3 cm by RT

## 2019-02-01 NOTE — ED Provider Notes (Addendum)
[facility] Emergency Department Provider Note  ____________________________________________   I have reviewed the triage vital signs and the nursing notes. Where available I have reviewed prior notes and, if possible and indicated, outside hospital notes.    HISTORY  Chief Complaint Loss of Consciousness    HPI Tracy Fitzpatrick is a 78 y.o. male  Patient seen and evaluated during the coronavirus epidemic during a time with low staffing patient is a him with significant CHF history on Eliquis, also history of atrial fibrillation recent hospitalization at [facility], history as per patient family and EMS patient self cannot give a history.  An EF of approximately 20% during his last hospitalization.  According to his wife he has been having increased dyspnea and feeling short of breath without fever or cough, he has been having increased orthopnea since his discharge from the hospital on the 26th.  He stated he did not feel very well today but he was doing all right and then he put his head back in the chair and passed out.  Did not fall, his wife noted that he was completely out of it, she called 911 and she had CPR.  EMS found him with a normal blood sugar, they found him in V. fib arrest they gave him 1 shock, epi x2 and sent him to the hospital here.  ROSC.  He was brought in with a King airway in place, brief downtime of less than 5 to 10 minutes estimated by EMS with bystander CPR.  Family made aware of all findings since his arrival.   Level 5 chart caveat; no further history available due to patient status.  Prior to Admission medications   Medication Sig Start Date End Date Taking? Authorizing Provider  apixaban (ELIQUIS) 5 MG TABS tablet Take 5 mg by mouth 2 (two) times a day. 01/25/19 02/24/19 Yes [provider]  dofetilide (TIKOSYN) 250 MCG capsule Take 250 mcg by mouth every 12 (twelve) hours. 01/25/19 01/25/20 Yes [provider]   methimazole (TAPAZOLE) 10 MG tablet Take 20 mg by mouth 2 (two) times a day. 01/25/19 01/25/20 Yes [provider]  metoprolol succinate (TOPROL-XL) 100 MG 24 hr tablet Take 100 mg by mouth daily. 01/25/19 01/25/20 Yes [provider]  amLODipine (NORVASC) 10 MG tablet Take 10 mg by mouth daily. 11/21/18   [provider]  benazepril (LOTENSIN) 20 MG tablet Take 20 mg by mouth daily. 12/17/18   [provider]  finasteride (PROSCAR) 5 MG tablet Take 5 mg by mouth daily. 11/11/18   [provider]  pravastatin (PRAVACHOL) 10 MG tablet TAKE 1 TABLET BY MOUTH EVERY DAY AT NIGHT 12/17/18   [provider]    Allergies Patient has no allergy information on record.  History reviewed. No pertinent family history.  Social History Social History   Tobacco Use  . Smoking status: Not on file  Substance Use Topics  . Alcohol use: Not on file  . Drug use: Not on file    Review of Systems Constitutional: No fever/chills Eyes: No visual changes. ENT: No sore throat. No stiff neck no neck pain Cardiovascular: Denies chest pain. Respiratory: Denies shortness of breath. Gastrointestinal:   no vomiting.  No diarrhea.  No constipation. Genitourinary: Negative for dysuria. Musculoskeletal: Negative lower extremity swelling Skin: Negative for rash. Neurological: Negative for severe headaches, focal weakness or numbness.   ____________________________________________   PHYSICAL EXAM:  VITAL SIGNS: ED Triage Vitals [03/01/2019 1744]  Enc Vitals  Group     BP (!) 88/77     Pulse Rate (!) 133     Resp (!) 33     Temp      Temp src      SpO2 98 %     Weight      Height      Head Circumference      Peak Flow      Pain Score      Pain Loc      Pain Edu?      Excl. in GC?     Constitutional: Sponsor but breathing on his own, Eyes: Conjunctivae are normal.  As are small and minimally reactive Head: Atraumatic HEENT: No  congestion/rhinnorhea. Mucous membranes are moist.  Oropharynx non-erythematous Neck:   Nontender with no meningismus, no masses, no stridor Cardiovascular: Slightly tachycardic rate, regular rhythm. Grossly normal heart sounds.  Good peripheral circulation. Respiratory: Airway in place but breathing on his own Abdominal: Soft and nontender. No distention. No guarding no rebound Back:  There is no focal tenderness or step off.  there is no midline tenderness there are no lesions noted. there is no CVA tenderness Musculoskeletal: No lower extremity tenderness, no upper extremity tenderness. No joint effusions, no DVT signs strong distal pulses no edema Neurologic: GCS 3 Skin:  Skin is warm, dry and intact. No rash noted.   ____________________________________________   LABS (all labs ordered are listed, but only abnormal results are displayed)  Labs Reviewed  GLUCOSE, CAPILLARY - Abnormal; Notable for the following components:      Result Value   Glucose-Capillary 178 (*)    All other components within normal limits  CBC WITH DIFFERENTIAL/PLATELET - Abnormal; Notable for the following components:   WBC 12.9 (*)    Neutro Abs 7.9 (*)    Abs Immature Granulocytes 0.39 (*)    All other components within normal limits  SARS CORONAVIRUS 2 (HOSPITAL ORDER, PERFORMED IN [facility])  COMPREHENSIVE METABOLIC PANEL  TROPONIN I  THYROID PANEL WITH TSH  PROTIME-INR  APTT  BLOOD GAS, ARTERIAL    Pertinent labs  results that were available during my care of the patient were reviewed by me and considered in my medical decision making (see chart for details). ____________________________________________  EKG  I personally interpreted any EKGs ordered by me or triage 2 EKGs were obtained by this patient, the first showed likely sinus rhythm rate 131 with LAFB and RBBB, no acute ST elevation or depression, second EKG showed right bundle branch block LAFB, rate 71 bpm, no acute ST  elevation or depression ____________________________________________  RADIOLOGY  Pertinent labs & imaging results that were available during my care of the patient were reviewed by me and considered in my medical decision making (see chart for details). If possible, patient and/or family made aware of any abnormal findings.  No results found. ____________________________________________    PROCEDURES  Procedure(s) performed: None  Procedure Name: Intubation Date/Time: 02/23/2019 7:50 PM Performed by: Jeanmarie Plant, MD Pre-anesthesia Checklist: Patient identified and Patient being monitored Oxygen Delivery Method: Ambu bag Preoxygenation: Pre-oxygenation with 100% oxygen Ventilation: Mask ventilation without difficulty Laryngoscope Size: Glidescope and 4 Grade View: Grade I Tube size: 7.5 mm Number of attempts: 1 Placement Confirmation: ETT inserted through vocal cords under direct vision,  Positive ETCO2,  CO2 detector and Breath sounds checked- equal and bilateral Tube secured with: ETT holder Comments: Tube advanced, after x ray tube advanced again. + frothy edema from chords  noted       Critical Care performed: CRITICAL CARE Performed by: Jeanmarie PlantJAMES A MCSHANE   Total critical care time: 55 minutes  Critical care time was exclusive of separately billable procedures and treating other patients.  Critical care was necessary to treat or prevent imminent or life-threatening deterioration.  Critical care was time spent personally by me on the following activities: development of treatment plan with patient and/or surrogate as well as nursing, discussions with consultants, evaluation of patient's response to treatment, examination of patient, obtaining history from patient or surrogate, ordering and performing treatments and interventions, ordering and review of laboratory studies, ordering and review of radiographic studies, pulse oximetry and re-evaluation of patient's  condition.   ____________________________________________   INITIAL IMPRESSION / ASSESSMENT AND PLAN / ED COURSE  Pertinent labs & imaging results that were available during my care of the patient were reviewed by me and considered in my medical decision making (see chart for details).  Patient here status post cardiac arrest, did have a return of spontaneous circulation.  No known trauma.  I did talk to Dr. and, of cardiology we discussed the patient's history and EKG findings, he does not feel the patient meets criteria for emergent cardiac catheterization at this point.  Do appreciate the consult.  I discussed with Dr. Renato ShinKaska, of ICU.  We discussed all of the patient's findings up to this point.  He thinks the patient likely is not a candidate for intervention in terms of lower temperature, he did request a CT scan which I have already ordered.  No further request were made by cardiology or ICU attending.  Patient is being admitted to Dr. Nancy MarusMayo.  Appreciate all of the consultant help with this patient.  I kept family up-to-date.  Family and I did discuss [facility] but they would prefer to keep him here to they know he is stabilized, MAP is In history of increasing orthopnea and likely CHF history, a low afterload is seen is desirable by ICU and we will maintain him at this level.  Patient did receive IV fluids from EMS, and about 200 cc from us before we knew his history.   ____________________________________________   FINAL CLINICAL IMPRESSION(S) / ED DIAGNOSES  Final diagnoses:  None      This chart was dictated using voice recognition software.  Despite best efforts to proofread,  errors can occur which can change meaning.      Jeanmarie PlantMcShane, James A, MD 02/13/2019 Luiz [id number]    Jeanmarie PlantMcShane, James A, MD 02/17/2019 [phone]

## 2019-02-01 NOTE — ED Notes (Signed)
Pt placed on dfib pads

## 2019-02-01 NOTE — Progress Notes (Signed)
Cardiac arrest at home. Chaplain provided spiritual and emotional support to wife (had performed CPR) and daughter (OR nurse at [employer]). Family allowed to see Emery which provided them comfort.      02/19/2019 1900  Clinical Encounter Type  Visited With Patient;Family  Visit Type ED;Critical Care;Spiritual support  Referral From Physician  Consult/Referral To Chaplain  Spiritual Encounters  Spiritual Needs Prayer;Emotional  Stress Factors  Patient Stress Factors Health changes

## 2019-02-01 NOTE — Progress Notes (Signed)
Pt transported to CT then to ICU 5 without incident on vent. Pt remains on vent and is tol well at this time.

## 2019-02-01 NOTE — Consult Note (Signed)
PULMONARY / CRITICAL CARE MEDICINE   Name: Tracy Fitzpatrick MRN: [id number] DOB: 04/29/1941    ADMISSION DATE:  02/17/2019 CONSULTATION DATE:  02/10/2019  REFERRING MD:  Dr. Brett Albino  CHIEF COMPLAINT:  Cardiac Arrest  BRIEF DISCUSSION: 78 y.o. male with a PMH notable for HFrEF (EF approx. 20%), A-fib on Eliquis, and CKD III, admitted after witnessed V-fib arrest at home with return of ROSC after approximately 5-10 minute downtown.  CT Head negative.  Upon arrival to ICU he remains unresponsive on no sedation, therefore Targeted Temperature Management at 36 C is being initiated.  HISTORY OF PRESENT ILLNESS:   Mr. Tracy Fitzpatrick is a 78 year old male with a past medical history notable for HFrEF (EF approximately 20%), atrial fibrillation on Eliquis, and CKD stage III who presents to [facility] ED on 02/15/2019 following a witnessed cardiac arrest.  Patient is currently intubated and unresponsive, therefore history is obtained from ED and nursing notes.  Per notes, the patient's wife reports that he has been having increased shortness of breath and orthopnea since recent discharge from [facility] on May 26 (he was hospitalized for A-fib w/ RVR during that stay).  Today he was not feeling very well, he went to sit in his recliner and he gasped and became unresponsive (he did not hit his head).  His wife called EMS and initiated CPR.  Upon EMS arrival the patient was found to be in V. fib arrest, which he received 1 shock and epinephrine x2 with ROSC obtained (length of downtime estimated to be 5-10 minutes).  Upon arrival to the ED his blood pressure was noted to be 88/77, pulse rate 133, EKG with right bundle branch block and LAFB but no acute ST elevation or depression.  Initial work-up in the ED revealed troponin  0.09, WBC 12.9, serum bicarb 18, creatinine 1.78, AST 43.  SARS-CoV-2 PCR is negative.  Urinalysis is negative, urine drug screen is negative.  Chest x-ray reveals mild cardiac enlargement, but no significant pulmonary  findings.  ED physician discussed with Cardiology, and was not felt that he met criteria for emergent cardiac catheterization.  CT head is negative.  He is being admitted to [facility] ICU for further work-up and treatment of V. fib arrest of unknown etiology.  Upon arrival to ICU he remains unresponsive on no sedation, therefore we will start targeted temperature management at [facility]. PCCM was consulted for further management.  PAST MEDICAL HISTORY :  He  has no past medical history on file.  PAST SURGICAL HISTORY: He  has no past surgical history on file.  Allergies  Allergen Reactions  . Penicillins Other (See Comments)    Other Reaction: fainting, Other reaction  . Latex Rash    No current facility-administered medications on file prior to encounter.    Current Outpatient Medications on File Prior to Encounter  Medication Sig  . apixaban (ELIQUIS) 5 MG TABS tablet Take 5 mg by mouth 2 (two) times a day.  . benazepril (LOTENSIN) 20 MG tablet Take 20 mg by mouth daily.  Marland Kitchen dofetilide (TIKOSYN) 250 MCG capsule Take 250 mcg by mouth every 12 (twelve) hours.  . dorzolamide-timolol (COSOPT) 22.3-6.8 MG/ML ophthalmic solution Place 1 drop into both eyes 2 (two) times daily.  . finasteride (PROSCAR) 5 MG tablet Take 5 mg by mouth daily.  Marland Kitchen latanoprost (XALATAN) 0.005 % ophthalmic solution Place 1 drop into both eyes at bedtime.  . methimazole (TAPAZOLE) 10 MG tablet Take 20 mg by mouth 2 (two) times a day.  Marland Kitchen  metoprolol succinate (TOPROL-XL) 100 MG 24 hr tablet Take 100 mg by mouth daily.  . pravastatin (PRAVACHOL) 10 MG tablet TAKE 1 TABLET BY MOUTH EVERY DAY AT NIGHT    FAMILY HISTORY:  His has no family status information on file.    SOCIAL HISTORY: He      COVID-19 DISASTER DECLARATION:  FULL CONTACT PHYSICAL EXAMINATION WAS NOT POSSIBLE DUE TO TREATMENT OF COVID-19 AND  CONSERVATION OF PERSONAL PROTECTIVE EQUIPMENT, LIMITED EXAM FINDINGS INCLUDE-  Patient assessed or the symptoms  described in the history of present illness.  In the context of the Global COVID-19 pandemic, which necessitated consideration that the patient might be at risk for infection with the SARS-CoV-2 virus that causes COVID-19, Institutional protocols and algorithms that pertain to the evaluation of patients at risk for COVID-19 are in a state of rapid change based on information released by regulatory bodies including the CDC and federal and state organizations. These policies and algorithms were followed during the patient's care while in hospital.   REVIEW OF SYSTEMS:   Unable to obtain due to intubation and critical illness  SUBJECTIVE:  Unable to obtain due to intubation and critical illness  VITAL SIGNS: BP 105/84   Pulse 73   Temp 98.5 F (36.9 C)   Resp (!) 24   SpO2 99%   HEMODYNAMICS:    VENTILATOR SETTINGS: Vent Mode: AC FiO2 (%):  [50 %] 50 % Set Rate:  [16 bmp] 16 bmp Vt Set:  [500 mL] 500 mL PEEP:  [5 cmH20] 5 cmH20  INTAKE / OUTPUT: No intake/output data recorded.  PHYSICAL EXAMINATION: General: Critically ill-appearing male, laying in bed, unresponsive on no sedation, in no acute distress Neuro: Unresponsive on no sedation, pupils PERRLA 4 mm sluggish bilaterally HEENT: Atraumatic, normocephalic, neck supple, ET tube in place, JVD Cardiovascular: RRR, S1-S2, no murmurs rubs or gallops, 2+ pulses Lungs: Breath sounds coarse bilaterally, vent assisted, even, nonlabored Abdomen: Soft, nontender, nondistended, no guarding or rebound tenderness, bowel sounds positive x4 Musculoskeletal: Normal bulk and tone, no deformities, no edema Skin: Warm and dry, no obvious rashes lesions or ulcerations  LABS:  BMET Recent Labs  Lab 02/02/2019 1743  NA 138  K 3.8  CL 108  CO2 18*  BUN 30*  CREATININE 1.78*  GLUCOSE 208*    Electrolytes Recent Labs  Lab 02/22/2019 1743  CALCIUM 8.3*    CBC Recent Labs  Lab 03/01/2019 1743  WBC 12.9*  HGB 13.5  HCT 42.9  PLT  233    Coag's Recent Labs  Lab 02/06/2019 1830  APTT 32  INR 1.5*    Sepsis Markers No results for input(s): LATICACIDVEN, PROCALCITON, O2SATVEN in the last 168 hours.  ABG No results for input(s): PHART, PCO2ART, PO2ART in the last 168 hours.  Liver Enzymes Recent Labs  Lab 02/09/2019 1743  AST 43*  ALT 34  ALKPHOS 89  BILITOT 1.1  ALBUMIN 3.0*    Cardiac Enzymes Recent Labs  Lab 02/23/2019 1743  TROPONINI 0.09*    Glucose Recent Labs  Lab 02/15/2019 1742  GLUCAP 178*    Imaging Dg Abdomen 1 View  Result Date: 02/08/2019 CLINICAL DATA:  NG tube placement. EXAM: ABDOMEN - 1 VIEW COMPARISON:  None. FINDINGS: External pacer paddles are noted. The NG tube tip is in the midesophagus and needs to be advanced several cm. Scattered air in the small bowel and colon. No obvious free air. IMPRESSION: The NG tube is in the mid esophagus and should be advanced  several cm. Electronically Signed   By: Marijo Sanes M.D.   On: 02/04/2019 18:40   Dg Chest Portable 1 View  Result Date: 02/18/2019 CLINICAL DATA:  Intubation. EXAM: PORTABLE CHEST 1 VIEW COMPARISON:  None. FINDINGS: The heart is mildly enlarged. The mediastinal and hilar contours are within normal limits. The endotracheal tube is 5.3 cm above the carina. The NG tube is in the mid esophagus and needs to be advanced several cm. The lungs are clear of acute process. No infiltrates or effusions. No pneumothorax. The bony thorax is intact. IMPRESSION: Mild cardiac enlargement but no significant pulmonary findings. The ET tube is 5.3 cm above the carina. The NG tube is in the mid esophagus and should be advanced several cm. Electronically Signed   By: Marijo Sanes M.D.   On: 02/09/2019 18:42     STUDIES:  CT Head 6/2>> No acute intracranial abnormality seen. Mild chronic ischemic white matter disease. Echocardiogram 6/3>> EEG 6/3>>  CULTURES: Blood x2 6/2>> Urine 6/2>> Tracheal aspirate 6/2>> SARS-CoV-2 6/2>>  negative MRSA PCR 6/2>> negative  ANTIBIOTICS: N/A  SIGNIFICANT EVENTS: 6/2>> admission to ICU 6/2>> Therapeutic Hypothermia 36C  LINES/TUBES: ETT 6/2>> Right IJ CVC 6/2>> Left Femoral A-line 6/2>>  ASSESSMENT / PLAN:  PULMONARY A: Acute Hypoxic Respiratory Failure in setting of cardiac arrest Hx: Pulmonary HTN P:   -Full Vent support: PRVC: 8 cc/kg -Wean FiO2 & PEEP as tolerated -Follow intermittent CXR & ABG -SBT when parameters met and mental status permits -VAP Bundle -Prn Bronchodilators  CARDIOVASCULAR A:  V-fib arrest of unknown etiology Elevated troponin, likely demand ischemia Hx: HFrEF P:  -Cardiac monitoring -Maintain MAP >65  -Levophed if needed to maintain MAP> 65  -Sepsis workup pending -Urine drug screen negative -CT Head negative for bleed -Cardiology consulted, appreciate input (ED physician conferred with Cardiology and pt was felt not to be a candidate for emergent cardiac cath) -Trend Troponin -Obtain Echocardiogram   RENAL A:   AKI on CKD III Metabolic Acidosis in setting of Lactic Acidosis P:   -Monitor I&O's / urinary output -Follow BMP -Ensure adequate renal perfusion -Avoid nephrotoxic agents as able -Replace electrolytes as indicated -Gentle IVF -Trend Lactic acid   GASTROINTESTINAL A:   No acute issues P:   -NPO -OG to LIS -Prontonix for SUP  HEMATOLOGIC A:   No acute issues P:  -Monitor for S/Sx of bleeding -Trend CBC -Heparin SQ for VTE Prophylaxis  -Hold home Eliquis for now as pt with increased risk of bleeding with induced Hypothermia -Transfuse for Hgb <7  INFECTIOUS A:   Leukocytosis P:   -Monitor fever curve -Trend WBC's and Procalcitonin -Obtain pan cultures -Procalcitonin minimally elevated, UA negative, CXR without infiltrate, will hold off of empiric antibiotics for now  ENDOCRINE A:   Hyperglycemia  Hx Hyperthyroidism P:   -CBG's -SSI -Follow ICU Hypo/hyperglycemia protocol -Thyroid  panel pending  NEUROLOGIC A:   Acute Metabolic Encephalopathy in setting of Cardiac arrest with concern for possible anoxic brain injury P:   -Will place on Targeted Temperature Management 36 C -RASS goal: -3 to -4 -Fentanyl and Propofol drips to maintain RASS goal and prevent shivering -CT Head negative -EEG pending -Once pt completes hypothermia, perform wake up assessment to assess Neuro status -Consider Neurology consult -Provide supportive care   FAMILY  - Updates:  Updated pt's wife and daughter via Telephone 02/24/2019.  - Inter-disciplinary family meet or Palliative Care meeting due by:  02/08/2019    Darel Hong, AGACNP-BC [facility] Pager: [phone] Cell: [phone]  02/11/2019, 7:25 PM

## 2019-02-02 ENCOUNTER — Other Ambulatory Visit: Payer: PRIVATE HEALTH INSURANCE

## 2019-02-02 ENCOUNTER — Inpatient Hospital Stay (HOSPITAL_COMMUNITY)
Admit: 2019-02-02 | Discharge: 2019-02-02 | Disposition: A | Discharge status: Home / Self Care | Payer: Medicare Other | Attending: Pulmonary Disease | Admitting: Pulmonary Disease

## 2019-02-02 ENCOUNTER — Inpatient Hospital Stay: Payer: Medicare Other

## 2019-02-02 ENCOUNTER — Other Ambulatory Visit: Payer: Self-pay

## 2019-02-02 DIAGNOSIS — I420 Dilated cardiomyopathy: Secondary | ICD-10-CM

## 2019-02-02 DIAGNOSIS — I469 Cardiac arrest, cause unspecified: Secondary | ICD-10-CM

## 2019-02-02 DIAGNOSIS — I361 Nonrheumatic tricuspid (valve) insufficiency: Secondary | ICD-10-CM

## 2019-02-02 DIAGNOSIS — J9600 Acute respiratory failure, unspecified whether with hypoxia or hypercapnia: Secondary | ICD-10-CM

## 2019-02-02 DIAGNOSIS — I4819 Other persistent atrial fibrillation: Secondary | ICD-10-CM

## 2019-02-02 DIAGNOSIS — J9601 Acute respiratory failure with hypoxia: Secondary | ICD-10-CM

## 2019-02-02 LAB — MRSA PCR SCREENING: MRSA by PCR: NEGATIVE

## 2019-02-02 LAB — COMPREHENSIVE METABOLIC PANEL
ALT: 60 U/L — ABNORMAL HIGH (ref 0–44)
AST: 69 U/L — ABNORMAL HIGH (ref 15–41)
Albumin: 3.2 g/dL — ABNORMAL LOW (ref 3.5–5.0)
Alkaline Phosphatase: 98 U/L (ref 38–126)
Anion gap: 9 (ref 5–15)
BUN: 40 mg/dL — ABNORMAL HIGH (ref 8–23)
CO2: 22 mmol/L (ref 22–32)
Calcium: 8.3 mg/dL — ABNORMAL LOW (ref 8.9–10.3)
Chloride: 112 mmol/L — ABNORMAL HIGH (ref 98–111)
Creatinine, Ser: 2.15 mg/dL — ABNORMAL HIGH (ref 0.61–1.24)
GFR calc Af Amer: 33 mL/min — ABNORMAL LOW (ref >60)
GFR calc non Af Amer: 28 mL/min — ABNORMAL LOW (ref >60)
Glucose, Bld: 142 mg/dL — ABNORMAL HIGH (ref 70–99)
Potassium: 4.7 mmol/L (ref 3.5–5.1)
Sodium: 143 mmol/L (ref 135–145)
Total Bilirubin: 2.3 mg/dL — ABNORMAL HIGH (ref 0.3–1.2)
Total Protein: 5.8 g/dL — ABNORMAL LOW (ref 6.5–8.1)

## 2019-02-02 LAB — TRIGLYCERIDES
Triglycerides: 69 mg/dL (ref <150)
Triglycerides: 74 mg/dL (ref <150)

## 2019-02-02 LAB — CBC
HCT: 44.2 % (ref 39.0–52.0)
Hemoglobin: 14 g/dL (ref 13.0–17.0)
MCH: 27.8 pg (ref 26.0–34.0)
MCHC: 31.7 g/dL (ref 30.0–36.0)
MCV: 87.7 fL (ref 80.0–100.0)
Platelets: 280 10*3/uL (ref 150–400)
RBC: 5.04 MIL/uL (ref 4.22–5.81)
RDW: 14.1 % (ref 11.5–15.5)
WBC: 16.5 10*3/uL — ABNORMAL HIGH (ref 4.0–10.5)
nRBC: 0 % (ref 0.0–0.2)

## 2019-02-02 LAB — GLUCOSE, CAPILLARY
Glucose-Capillary: 106 mg/dL — ABNORMAL HIGH (ref 70–99)
Glucose-Capillary: 119 mg/dL — ABNORMAL HIGH (ref 70–99)
Glucose-Capillary: 121 mg/dL — ABNORMAL HIGH (ref 70–99)
Glucose-Capillary: 157 mg/dL — ABNORMAL HIGH (ref 70–99)
Glucose-Capillary: 91 mg/dL (ref 70–99)
Glucose-Capillary: 98 mg/dL (ref 70–99)

## 2019-02-02 LAB — TROPONIN I
Troponin I: 0.39 ng/mL (ref <0.03)
Troponin I: 0.34 ng/mL (ref <0.03)
Troponin I: 0.3 ng/mL (ref <0.03)
Troponin I: 0.21 ng/mL (ref <0.03)

## 2019-02-02 LAB — BASIC METABOLIC PANEL
Anion gap: 11 (ref 5–15)
BUN: 46 mg/dL — ABNORMAL HIGH (ref 8–23)
CO2: 19 mmol/L — ABNORMAL LOW (ref 22–32)
Calcium: 8.2 mg/dL — ABNORMAL LOW (ref 8.9–10.3)
Chloride: 110 mmol/L (ref 98–111)
Creatinine, Ser: 2.32 mg/dL — ABNORMAL HIGH (ref 0.61–1.24)
GFR calc Af Amer: 30 mL/min — ABNORMAL LOW (ref >60)
GFR calc non Af Amer: 26 mL/min — ABNORMAL LOW (ref >60)
Glucose, Bld: 119 mg/dL — ABNORMAL HIGH (ref 70–99)
Potassium: 4.4 mmol/L (ref 3.5–5.1)
Sodium: 140 mmol/L (ref 135–145)

## 2019-02-02 LAB — ECHOCARDIOGRAM COMPLETE
Height: 74 in
Weight: 2744.29 oz

## 2019-02-02 LAB — BLOOD GAS, ARTERIAL
Acid-base deficit: 5.9 mmol/L — ABNORMAL HIGH (ref 0.0–2.0)
Bicarbonate: 20.2 mmol/L (ref 20.0–28.0)
FIO2: 0.5
MECHVT: 500 mL
Mechanical Rate: 16
O2 Saturation: 99.6 %
PEEP: 5 cmH2O
Patient temperature: 36.2
pCO2 arterial: 41 mmHg (ref 32.0–48.0)
pH, Arterial: 7.31 — ABNORMAL LOW (ref 7.350–7.450)
pO2, Arterial: 187 mmHg — ABNORMAL HIGH (ref 83.0–108.0)

## 2019-02-02 LAB — HEMOGLOBIN A1C
Hgb A1c MFr Bld: 6 % — ABNORMAL HIGH (ref 4.8–5.6)
Mean Plasma Glucose: 125.5 mg/dL

## 2019-02-02 LAB — LACTIC ACID, PLASMA: Lactic Acid, Venous: 1.3 mmol/L (ref 0.5–1.9)

## 2019-02-02 LAB — PROCALCITONIN: Procalcitonin: 3.8 ng/mL

## 2019-02-02 LAB — TSH: TSH: <0.01 u[IU]/mL — ABNORMAL LOW (ref 0.350–4.500)

## 2019-02-02 LAB — MAGNESIUM: Magnesium: 2.1 mg/dL (ref 1.7–2.4)

## 2019-02-02 LAB — BRAIN NATRIURETIC PEPTIDE: B Natriuretic Peptide: 3727 pg/mL — ABNORMAL HIGH (ref 0.0–100.0)

## 2019-02-02 MED ORDER — VECURONIUM BROMIDE 10 MG IV SOLR
10.0000 mg | INTRAVENOUS | Status: DC | PRN
  Administered 2019-02-02 (×4): 10 mg via INTRAVENOUS
  Filled 2019-02-02 (×4): qty 10

## 2019-02-02 MED ORDER — SODIUM CHLORIDE 0.9 % IV SOLN
INTRAVENOUS | Status: DC | PRN
  Administered 2019-02-02: 1000 mL via INTRAVENOUS

## 2019-02-02 MED ORDER — HEPARIN SODIUM (PORCINE) 5000 UNIT/ML IJ SOLN
5000.0000 [IU] | Freq: Three times a day (TID) | INTRAMUSCULAR | Status: DC
  Administered 2019-02-02 – 2019-02-03 (×2): 5000 [IU] via SUBCUTANEOUS
  Filled 2019-02-02 (×2): qty 1

## 2019-02-02 MED ORDER — CHLORHEXIDINE GLUCONATE 0.12% ORAL RINSE (MEDLINE KIT)
15.0000 mL | Freq: Two times a day (BID) | OROMUCOSAL | Status: DC
  Administered 2019-02-02 – 2019-02-04 (×5): 15 mL via OROMUCOSAL

## 2019-02-02 MED ORDER — ORAL CARE MOUTH RINSE
15.0000 mL | OROMUCOSAL | Status: DC
  Administered 2019-02-02 – 2019-02-04 (×24): 15 mL via OROMUCOSAL

## 2019-02-02 NOTE — Progress Notes (Signed)
eeg completed ° °

## 2019-02-02 NOTE — Progress Notes (Signed)
*  PRELIMINARY RESULTS* Echocardiogram 2D Echocardiogram has been performed.  Joanette Gula Leianne Callins 02/02/2019, 12:08 PM

## 2019-02-02 NOTE — Progress Notes (Signed)
Clinical status relayed to family  Updated and notified of patients medical condition-  Progressive multiorgan failure with very low chance of meaningful recovery.   Family understands the situation.  They have consented and agreed to DNR status.   Family are satisfied with Plan of action and management. All questions answered  Lucie Leather, M.D.  [facility]  Medical Director [facility] Medical Director [facility] Cardio-Pulmonary Department

## 2019-02-02 NOTE — Progress Notes (Addendum)
0700 hrs: Report received from [place], RN.  0900 hrs: OGT removed (malpositioned on XR), and NGT placed. XR confirmation pending.   1015 hrs: NGT placement confirmed. RN also updated the patient's wife and daughter at this time via telephone.

## 2019-02-02 NOTE — Progress Notes (Signed)
[employer] - [place] [facility]   PATIENT NAME: Tracy Fitzpatrick    MR#:  [id number]  DATE OF BIRTH:  [birth year]  SUBJECTIVE:  CHIEF COMPLAINT:   Chief Complaint  Patient presents with  . Loss of Consciousness   Patient sedated on the vent  REVIEW OF SYSTEMS:  ROS Unobtainable  DRUG ALLERGIES:   Allergies  Allergen Reactions  . Penicillins Other (See Comments)    Other Reaction: fainting, Other reaction  . Latex Rash   VITALS:  Blood pressure 110/85, pulse 64, temperature (!) 97.2 F (36.2 C), temperature source Bladder, resp. rate 16, height 6\' 2"  (1.88 m), weight 77.8 kg, SpO2 99 %. PHYSICAL EXAMINATION:   Physical Exam  Constitutional: He appears well-developed.  Sedated on the vent  HENT:  Head: Normocephalic and atraumatic.  Eyes: Pupils are equal, round, and reactive to light. Right eye exhibits no discharge.  Neck: Normal range of motion. No tracheal deviation present.  Cardiovascular: Normal rate, regular rhythm and normal heart sounds.  Respiratory: Effort normal.  Sedated on the vent  GI: Soft. Bowel sounds are normal. He exhibits no distension.  Musculoskeletal:        General: No deformity or edema.  Neurological:  Sedated on the vent  Skin: No rash noted. No erythema.  Psychiatric:  Sedated on the vent   LABORATORY PANEL:  Male CBC Recent Labs  Lab 02/02/19 0424  WBC 16.5*  HGB 14.0  HCT 44.2  PLT 280   ------------------------------------------------------------------------------------------------------------------ Chemistries  Recent Labs  Lab 02/02/19 0424 02/02/19 1321  NA 143 140  K 4.7 4.4  CL 112* 110  CO2 22 19*  GLUCOSE 142* 119*  BUN 40* 46*  CREATININE 2.15* 2.32*  CALCIUM 8.3* 8.2*  AST 69*  --   ALT 60*  --   ALKPHOS 98  --   BILITOT 2.3*  --    RADIOLOGY:  Dg Abd 1 View  Result Date: 02/02/2019 CLINICAL DATA:  Check gastric catheter placement EXAM: ABDOMEN - 1 VIEW COMPARISON:  11-04-18  FINDINGS: Gastric catheter has now reached the stomach. Scattered large and small bowel gas is noted. IMPRESSION: Gastric catheter within the stomach. Electronically Signed   By: Alcide CleverMark  Lukens M.D.   On: 02/02/2019 09:37   Dg Abdomen 1 View  Result Date: 02/12/2019 CLINICAL DATA:  NG tube placement. EXAM: ABDOMEN - 1 VIEW COMPARISON:  None. FINDINGS: External pacer paddles are noted. The NG tube tip is in the midesophagus and needs to be advanced several cm. Scattered air in the small bowel and colon. No obvious free air. IMPRESSION: The NG tube is in the mid esophagus and should be advanced several cm. Electronically Signed   By: Rudie MeyerP.  Gallerani M.D.   On: 11-04-18 18:40   Ct Head Wo Contrast  Result Date: 02/21/2019 CLINICAL DATA:  Positive loss of consciousness. EXAM: CT HEAD WITHOUT CONTRAST TECHNIQUE: Contiguous axial images were obtained from the base of the skull through the vertex without intravenous contrast. COMPARISON:  None. FINDINGS: Brain: Mild chronic ischemic white matter disease is noted. No mass effect or midline shift is noted. Ventricular size is within normal limits. There is no evidence of mass lesion, hemorrhage or acute infarction. Vascular: No hyperdense vessel or unexpected calcification. Skull: Normal. Negative for fracture or focal lesion. Sinuses/Orbits: No acute finding. Other: None. IMPRESSION: Mild chronic ischemic white matter disease. No acute intracranial abnormality seen. Electronically Signed   By: Lupita RaiderJames  Green Jr M.D.   On: 11-04-18  20:09   Dg Chest Port 1 View  Result Date: 02/02/2019 CLINICAL DATA:  Central line placement EXAM: PORTABLE CHEST 1 VIEW COMPARISON:  2019/02/28 FINDINGS: The endotracheal tube terminates above the carina by 3 cm. There is a newly placed right-sided central venous catheter with tip projecting over the SVC. There is no evidence of a right-sided pneumothorax. The enteric tube remains malpositioned. The heart size is enlarged. There are  prominent interstitial lung markings. Bilateral rib fractures are again noted. IMPRESSION: 1. Well-positioned central venous catheter without evidence of a pneumothorax. 2. Endotracheal tube as above. 3. OG tube is malpositioned.  Repositioning is recommended. 4. Stable additional findings as above. Electronically Signed   By: Katherine Mantle M.D.   On: 02/02/2019 00:07   Dg Chest Port 1 View  Result Date: 28-Feb-2019 CLINICAL DATA:  Intubation EXAM: PORTABLE CHEST 1 VIEW COMPARISON:  02-28-19 FINDINGS: The endotracheal tube terminates approximately 3.4 cm above the carina. The enteric tube is malpositioned in terminates at the level of the mid esophagus. The heart size is enlarged. Diffuse interstitial lung markings are again noted. There is no pneumothorax. No large pleural effusion. No acute osseous abnormality. There are probable acute displaced left-sided rib fractures. There are probable multiple displaced right-sided rib fractures. IMPRESSION: 1. Endotracheal tube terminates 3.4 cm above the carina. 2. OG tube terminates at the level of the mid esophagus. Repositioning is recommended. 3. Cardiomegaly with worsening interstitial lung markings concerning for progressive pulmonary edema/volume overload. 4. Acute bilateral rib fractures are noted, likely related to CPR. Electronically Signed   By: Katherine Mantle M.D.   On: 28-Feb-2019 20:36   Dg Chest Portable 1 View  Result Date: February 28, 2019 CLINICAL DATA:  Intubation. EXAM: PORTABLE CHEST 1 VIEW COMPARISON:  None. FINDINGS: The heart is mildly enlarged. The mediastinal and hilar contours are within normal limits. The endotracheal tube is 5.3 cm above the carina. The NG tube is in the mid esophagus and needs to be advanced several cm. The lungs are clear of acute process. No infiltrates or effusions. No pneumothorax. The bony thorax is intact. IMPRESSION: Mild cardiac enlargement but no significant pulmonary findings. The ET tube is 5.3 cm above the  carina. The NG tube is in the mid esophagus and should be advanced several cm. Electronically Signed   By: Rudie Meyer M.D.   On: 2019-02-28 18:42   ASSESSMENT AND PLAN:   78 y.o. male with a known history of atrial fibrillation on Eliquis, CKD stage III, hypertension, hyperlipidemia, GERD, arthritis, prediabetes, prostate cancer and CHF presenting to the ED post witnessed cardiac arrest.  1.  Cardiac arrest  - patient presented with witnessed V. fib arrest post ACLS with ROSC  - Admitted to ICU - Mechanical ventilation - CT head no acute findings - Management per ICU team  2. Elevated troponins - Status post cardiac arrest and ACLS - Trending troponin . Follow up cardiology input and Echo  3. Systolic Congestive Heart Failure   Last Echo 01/18/19 EF 15% - Repeat Echo post cardiac arrest - On metoprolol and Lotensin held for low BP  3. 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 Atrial fibrillation -anticoagulated on Eliquis - Holding Eliquis. Defer to cardiology  5. Hypertension -now hypotensive in the setting of cardiac arrest - Holding all home BP meds  6. CKD (chronic kidney disease) stage 3, GFR 30-59 ml/min  Worsening - Hold nephrotoxins  DVT proph: Heparin  All the records are reviewed and case discussed with Care Management/Social Worker  CODE STATUS: DNR ICU physician discussed with family and patient has been made DNR  TOTAL TIME TAKING CARE OF THIS PATIENT: 39 minutes.   More than 50% of the time was spent in counseling/coordination of care: YES  POSSIBLE D/C IN 3 DAYS, DEPENDING ON CLINICAL CONDITION.   Narjis Mira M.D on 02/02/2019 at 3:50 PM  Between 7am to 6pm - Pager - [phone]  After 6pm go to [email or web address] - Social research officer, government  [facility]  Office  [phone]  CC: Primary care physician; Rayetta Humphrey, MD  Note: This dictation was prepared with  Dragon dictation along with smaller phrase technology. Any transcriptional errors that result from this process are unintentional.

## 2019-02-02 NOTE — Plan of Care (Addendum)
Shift summary: patient remains on hypothermia protocol until 2300 hrs tonight. Has required Vec doses X 3 so far this shift for shivering. Weaning down Levophed to lowest possible dose. UOP improving from overnight. Family aware of patient's status and plan of care.     Problem: Health Behavior/Discharge Planning: Goal: Ability to manage health-related needs will improve Outcome: Progressing   Problem: Education: Goal: Knowledge of General Education information will improve Description Including pain rating scale, medication(s)/side effects and non-pharmacologic comfort measures Outcome: Progressing   Problem: Clinical Measurements: Goal: Ability to maintain clinical measurements within normal limits will improve Outcome: Progressing Goal: Will remain free from infection Outcome: Progressing Goal: Diagnostic test results will improve Outcome: Progressing Goal: Respiratory complications will improve Outcome: Progressing Goal: Cardiovascular complication will be avoided Outcome: Progressing   Problem: Activity: Goal: Risk for activity intolerance will decrease Outcome: Progressing   Problem: Nutrition: Goal: Adequate nutrition will be maintained Outcome: Progressing   Problem: Coping: Goal: Level of anxiety will decrease Outcome: Progressing   Problem: Elimination: Goal: Will not experience complications related to bowel motility Outcome: Progressing Goal: Will not experience complications related to urinary retention Outcome: Progressing   Problem: Pain Managment: Goal: General experience of comfort will improve Outcome: Progressing   Problem: Safety: Goal: Ability to remain free from injury will improve Outcome: Progressing   Problem: Skin Integrity: Goal: Risk for impaired skin integrity will decrease Outcome: Progressing

## 2019-02-02 NOTE — Procedures (Signed)
ELECTROENCEPHALOGRAM REPORT   Patient: Tracy Fitzpatrick       Room #: IC05A-AA EEG No. ID: [id number] Age: 78 y.o.        Sex: male Referring Physician: Enid Baas Report Date:  02/02/2019        Interpreting Physician: Thana Farr  History: Tracy Fitzpatrick is an 78 y.o. male s/p arrest  Medications:  Insulin, Fentanyl, Levophed, Diprovan  Conditions of Recording:  This is a 21 channel routine scalp EEG performed with bipolar and monopolar montages arranged in accordance to the international 10/20 system of electrode placement. One channel was dedicated to EKG recording.  The patient is in the intubated and sedated state.  Description:  The background activity is very low voltage.  Sensitivity was increased for viewing to 3uV/mm from the standard 7uV/mm.  At this sensitivity the background is slow and poorly organized,  It consists of a mixture of polymorphic delta and theta activity.  This activity is discontinuous an there are short periods of attenuation that interrupt the background.  For the most part these periods of attenuation are synchronous between the hemispheres but there are occasions when this attenuation is more prolonged over the right hemisphere.   At times the amplitude overall seems decreased over the right hemisphere but this is difficult to distinguish from artifact which is persistent in this area.   No epileptiform activity is noted.   Hyperventilation and intermittent photic stimulation were not performed.  IMPRESSION: This is an abnormal electroencephalogram secondary to a slow, discontinuous background.  This finding is consistent with current medications.  No epileptiform activity is noted.     Thana Farr, MD Neurology [phone] 02/02/2019, 1:37 PM

## 2019-02-02 NOTE — Consult Note (Signed)
Cardiology Consultation:   Patient ID: CHER EGNOR MRN: [id number]; DOB: 11/03/1940  Admit date: 01/31/2019 Date of Consult: 02/02/2019  Primary Care Provider: Sharyne Peach, MD Primary Cardiologist: [facility] Primary Electrophysiologist:  None    Patient Profile:   Tracy Fitzpatrick is a 78 y.o. male with a hx of HTN, Afib with RVR on Eliquis and Tikosyn, HLD, CHF (LVEF <15%), pulmonary HTN, DM2, GERD, CKDIII, hyperthyroidism with recent L nodule, prostate CA, OA who is being seen today for the evaluation of VFib and cardiac arrest at the request of Darel Hong, NP.  History of Present Illness:   Tracy Fitzpatrick is a 78 yo male followed by [place].  He has no prior cardiac history prior to his admission to [facility] for new onset Afib with RVR on 01/18/2019 as outlined below.  He also has a L thyroid nodule s/p FNA 20 and started on methimazole and metoprolol for management of his symptoms.  He was admitted to [facility] 01/18/2019-01/25/2019. On 5/19, he presented with symptomatic Afib with RVR and c/o SOB. Per review of EMR, it was thought that this Afib was triggered by his underlying hyperthyroid. EKG 5/19 showed atrial fibrillation with ventricular rate of 118, and QTc 44m. 01/18/2019 TTE showed EF 15%, mild AR/MR/TR, normal RV size and function, mild pulmonary HTN. TEE 5/21 was without signs of left atrial appendage thrombus as below.  He received IV lasix, which did not alleviate his SOB. He was then started on metoprolol with suboptimal rate control. He was placed on IV digoxin x1 dose. He developed dizziness on diltiazem without improvement in his rate or conversion to sinus rhythm. He underwent failed DCCV 5/21 with single 200J shock x2 and SR achieved after each shock for ~2 minutes but then returned to Afib each time.  Pan was therefore for full Tikosyn load before repeat DCCV without repeat TEE as was maintained on heparin. He started on Tikosyn load. Of  note, he also underwent [place] guided thyroid bx on 5/22. EKG 5/26 showing Afib with RVR, aberrancy v PVCs, anteroseptal infarct of undetermined age, and TW abnormality with possible lateral ischemia, ventricular rate 125bpm, QRS 1141m QT 306, QTc 441. He then underwent a second and successful DCCV 01/25/2019 with a single, biphasic, synchronized 200J shock. He was started on Eliquis 63m57mID with CHA2DS2VASc score of at least 5 (CHF, HTN, agex2, DM2). Due to the price, he was reportedly going to look into cheaper options before switching to Coumadin. He was discharged 5/26 with follow-up 5/29 and continued Eliquis 63mg963mD, Toprol XL 100mg863mly, Tikosyn 250mg 87mID, benazepril 20mg d363m, and pravastatin 10mg da66m Further follow-up scheduled for 6/4  with [facility] 168lbs. Vitals significant for BP 115/74, HR 54 on 5/29.   He presented to [facility] ED [facility] following winessed cardiac arrest and was noted to be unresponsive and intubated. History was therefore obtained from ED and nursing notes. Per review of EMR, the patient's wife reported the patient had been experiencing increased SOB and orthopnea since his discharge 5/26.  He was experiencing orthopnea and sleeping in a recliner each night.  On 01/31/2019, he reportedly became confused on his way to the bathroom.  He was then not feeling well 6/2 and reportedly went to sit in his recliner, gasped, and then became unresponsive. He did not hit his head. His wife called EMS and initiated CPR. EMS found him in Vfib and administered 1  shock and epix2 before ROSC and transfer to [facility] ED. Estimated downtime of less than 5-53m  On arrival to the ED, EKG showed atrial tachycardia with rate 131bpm. He remained unresponsive. BP 88/77, RR 33. He was therefore intubated.  Cardiology was consulted while the patient was still in the ED; however, it was felt that the patient did not meet criteria for emergent cardiac catheterization at that  time.  CT head was performed and negative. He has continued to remain unresponsive and intubated with hypothermia protocol initiated after discussion with the patient's wife yesterday 6/2. Cr 1.4 on 6/2 with BUN 23. 6/2 EKG with sinus bradycardia, 53 bpm, LAD, left anterior fascicular block, incomplete right bundle branch block with QRS 96, prolonged QT with QTc 591, T wave inversion in leads I-III, avF, V3-V6, ST/T changes noted in anterolateral leads/inferior leads. 6/3 EKG SR, 64 bpm, PACs, LAD, anteroseptal infarct (documented as cited on or before February 01, 2019), TWI in I-III, aVF, V2-V3, V4-V6, consideration of prior inferior infarct and lateral ischemia, prolonged QT with QTC 585.  Home Medications:  Prior to Admission medications   Medication Sig Start Date End Date Taking? Authorizing Provider  apixaban (ELIQUIS) 5 MG TABS tablet Take 5 mg by mouth 2 (two) times a day. 01/25/19 02/24/19 Yes [provider]  benazepril (LOTENSIN) 20 MG tablet Take 20 mg by mouth daily. 12/17/18  Yes [provider]  dofetilide (TIKOSYN) 250 MCG capsule Take 250 mcg by mouth every 12 (twelve) hours. 01/25/19 01/25/20 Yes [provider]  dorzolamide-timolol (COSOPT) 22.3-6.8 MG/ML ophthalmic solution Place 1 drop into both eyes 2 (two) times daily.   Yes [provider]  finasteride (PROSCAR) 5 MG tablet Take 5 mg by mouth daily. 11/11/18  Yes [provider]  latanoprost (XALATAN) 0.005 % ophthalmic solution Place 1 drop into both eyes at bedtime.   Yes [provider]  methimazole (TAPAZOLE) 10 MG tablet Take 20 mg by mouth 2 (two) times a day. 01/25/19 01/25/20 Yes [provider]  metoprolol succinate (TOPROL-XL) 100 MG 24 hr tablet Take 100 mg by mouth daily. 01/25/19 01/25/20 Yes [provider]  pravastatin (PRAVACHOL) 10 MG tablet TAKE 1 TABLET BY MOUTH EVERY DAY AT NIGHT 12/17/18  Yes [provider]    Inpatient  Medications: Scheduled Meds:  chlorhexidine gluconate (MEDLINE KIT)  15 mL Mouth Rinse BID   Chlorhexidine Gluconate Cloth  6 each Topical Q0600   heparin  5,000 Units Subcutaneous Q8H   insulin aspart  0-15 Units Subcutaneous Q4H   mouth rinse  15 mL Mouth Rinse 10 times per day   pantoprazole (PROTONIX) IV  40 mg Intravenous Daily   Continuous Infusions:  fentaNYL infusion INTRAVENOUS 100 mcg/hr (02/02/19 0700)   norepinephrine (LEVOPHED) Adult infusion 10 mcg/min (02/02/19 0700)   propofol (DIPRIVAN) infusion 10 mcg/kg/min (02/02/19 0700)   PRN Meds: fentaNYL, ipratropium-albuterol  Allergies:    Allergies  Allergen Reactions   Penicillins Other (See Comments)    Other Reaction: fainting, Other reaction   Latex Rash    Social History:   Social History   Socioeconomic History   Marital status: Married    Spouse name: Not on file   Number of children: Not on file   Years of education: Not on file   Highest education level: Not on file  Occupational History   Not on file  Social Needs   Financial resource strain: Not on file   Food insecurity:    Worry: Not  on file    Inability: Not on file   Transportation needs:    Medical: Not on file    Non-medical: Not on file  Tobacco Use   Smoking status: Not on file  Substance and Sexual Activity   Alcohol use: Not on file   Drug use: Not on file   Sexual activity: Not on file  Lifestyle   Physical activity:    Days per week: Not on file    Minutes per session: Not on file   Stress: Not on file  Relationships   Social connections:    Talks on phone: Not on file    Gets together: Not on file    Attends religious service: Not on file    Active member of club or organization: Not on file    Attends meetings of clubs or organizations: Not on file    Relationship status: Not on file   Intimate partner violence:    Fear of current or ex partner: Not on file    Emotionally abused: Not on  file    Physically abused: Not on file    Forced sexual activity: Not on file  Other Topics Concern   Not on file  Social History Narrative   Not on file    Family History:     Family History (per [place])  Medical History Relation Name Comments  Kidney failure Brother    Osteoarthritis Brother    Coronary Artery Disease (Blocked arteries around heart) Father    High blood pressure (Hypertension) Mother    Hyperlipidemia (Elevated cholesterol) Mother    Stroke Mother    Uterine cancer Mother    Breast cancer Neg Hx    Prostate cancer Neg Hx     Relation Name Status Comments  Brother  Deceased (Age 42)   Father  Deceased (Age 35)   Mother  Deceased (Age 69)      ROS:  Please see the history of present illness.  Review of Systems  Unable to perform ROS: Patient unresponsive    All other ROS reviewed and negative.     Physical Exam/Data:   Vitals:   02/02/19 0645 02/02/19 0700 02/02/19 0715 02/02/19 0730  BP: _0 94/73  Pulse: (!) 55 (!) 56 (!) 55 (!) 59  Resp: _1 Temp:  (!) 96.6 F (35.9 C)    TempSrc:  Bladder    SpO2: 100% 100% 100% 100%  Weight:      Height:        Intake/Output Summary (Last 24 hours) at 02/02/2019 0750 Last data filed at 02/02/2019 0700 Gross per 24 hour  Intake 1032.79 ml  Output 125 ml  Net 907.79 ml   Filed Weights   02/11/2019 2006  Weight: 77.8 kg   Body mass index is 22.02 kg/m.  General:  Appears older than stated age.  Intubated. Unresponsive. On hypothermia protocol HEENT: Intubated Neck: no JVD Vascular: 1+ b/l radial pulses  Cardiac:  normal S1, S2; tachycardic but regular, no murmur  Lungs:  intubated  Abd: soft, nontender, no hepatomegaly  Ext: hypothermia protocol, no LEE Musculoskeletal:  No deformities, BUE and BLE strength normal and equal Skin: hypothermia protocol Neuro:  unresponsive Psych:  unresponsive  EKG:  The EKG was personally reviewed and  demonstrates:   6/2 EKG with sinus bradycardia, 53 bpm, LAD, left anterior fascicular block, incomplete right bundle branch block with QRS 96, prolonged QT with QTc 591, T wave inversion in  leads I-III, avF, V3-V6, ST/T changes noted in anterolateral leads/inferior leads 6/3 EKG SR, 64 bpm, PACs, LAD, anteroseptal infarct (documented as cited on or before February 01, 2019), TWI in I-III, aVF, V2-V3, V4-V6, consideration of prior inferior infarct and lateral ischemia, prolonged QT with QTC 585 Telemetry:  Telemetry was personally reviewed and demonstrates: SB, SR with ectopy, sinus tachycardia, atrial tachycardia, NSVT  Relevant CV Studies: Pending echo  TEE 01/20/2019 [place], [place] member of [place], [place] #: [id number]                                Date: 01/20/2019 08: 05 AM      CardioPulmonary Services              Adult  Male  Age: 19 yrs                                Inpatient    ECHOCARDIOGRAM REPORT                 [id number]^^[id number]                                MD1: ESCHBACH, JEFFREY                                CHARLES    STUDY:TRANSESOPHAGEAL     TAPE:0000: 00: 0: 00: 00    ECHO:Yes  DOPPLER:Yes    FILE:0000-000-000    BP: 114/89 mmHg    COLOR:Yes  CONTRAST:No   MACHINE:Philips       HR: 130  RV BIOPSY:No     3D:No SOUND QLTY:Moderate      Height: 74 in   MEDIUM:None                       Weight: 173 lb                                BSA: 2.0  m2 _________________________________________________________________________________________ _________________________________________________________________________________________ ANESTHESIA       Midazolam: Yes              Fentanyl: Yes        Propofol: Yes       Lidocaine: Yes       Intubation: Intubation accomplished without difficulty.     Complications: None, PROCEDURE Informed Consent was obtained from the patient prior to the procedure VALVE REGURGITATIONS           AR: No AR                PR: No PR           MR: MILD MR               TR: No TR MITRAL VALVE        Leaflets: Normal  Morphology: Normal        Mobility: Fully Mobile AORTIC VALVE        AoCusps: Tricuspid          Morphology: Normal        Mobility: Fully Mobile TRICUSPID VALVE        Leaflets: Normal           Morphology: Normal        Mobility: Fully Mobile PULMONIC VALVE        Leaflets: Normal           Morphology: Normal        Mobility: Fully Mobile LEFT VENTRICLE          Size: Normal          Contractility: SEVERE GLOBAL DECREASE RIGHT VENTRICLE          Size: Normal            Free Wall: Normal PERICARDIUM/CONTRAST         Fluid: No effusion _________________________________________________________________________________________  CONCLUSIONS NO LEFT ATRIAL APPENDAGE THROMBUS MILD MR _________________________________________________________________________________________  Electronically signed by: Leonia Reeves, MDon 01/20/2019 12: 50 PM      Performed By: Leonia Reeves, MD   Ordering Physician: Alisia Ferrari _________________________________________________________________________________________    Lacy Duverney RCS  Other Result  Information  Interface, Text Results In - 01/20/2019 12:50 PM EDT                                                              Version 2           [facility]                             Myrtie Soman           A member of [place]         [id number]           [place], [place] #: [id number]                                                              Date: 01/20/2019 08: 23 AM           CardioPulmonary Services                           Adult   Male   Age: 68 yrs                                                              Inpatient       ECHOCARDIOGRAM REPORT  [id number]                                                              MD1: ESCHBACH, JEFFREY                                                               CHARLES      STUDY:TRANSESOPHAGEAL          TAPE:0000: 00: 0: 00: 00       ECHO:Yes    DOPPLER:Yes       FILE:0000-000-000        BP: 114/89 mmHg      COLOR:Yes   CONTRAST:No     MACHINE:Philips             HR: 130  RV BIOPSY:No          3D:No  SOUND QLTY:Moderate            Height: 74 in     MEDIUM:None                                              Weight: 173 lb                                                              BSA: 2.0 m2 _________________________________________________________________________________________ _________________________________________________________________________________________ ANESTHESIA             Midazolam: Yes                           Fentanyl: Yes              Propofol: Yes             Lidocaine: Yes            Intubation: Intubation accomplished without difficulty.         Complications: None, PROCEDURE Informed Consent was obtained from the patient prior to the procedure VALVE REGURGITATIONS                    AR: No AR                               PR: No PR                    MR: MILD MR                             TR: No TR MITRAL  VALVE              Leaflets: Normal  Morphology: Normal              Mobility: Fully Mobile AORTIC VALVE               AoCusps: Tricuspid                   Morphology: Normal              Mobility: Fully Mobile TRICUSPID VALVE              Leaflets: Normal                      Morphology: Normal              Mobility: Fully Mobile PULMONIC VALVE              Leaflets: Normal                      Morphology: Normal              Mobility: Fully Mobile LEFT VENTRICLE                  Size: Normal                   Contractility: SEVERE GLOBAL DECREASE RIGHT VENTRICLE                  Size: Normal                       Free Wall: Normal PERICARDIUM/CONTRAST                 Fluid: No effusion _________________________________________________________________________________________  CONCLUSIONS NO LEFT ATRIAL APPENDAGE THROMBUS MILD MR _________________________________________________________________________________________  Electronically signed by: Leonia Reeves, MDon 01/20/2019 12: 11 PM          Performed By: Leonia Reeves, MD    Ordering Physician: Alisia Ferrari _________________________________________________________________________________________      Lacy Duverney RCS     TTE 01/18/2019 LV Ejection Fraction (%) 15   Aortic Valve Stenosis Grade none   Aortic Valve Regurgitation Grade mild   Mitral Valve Stenosis Grade none   Mitral Valve Regurgitation Grade mild   Tricuspid Valve Regurgitation Grade mild   LV End Diastolic Diameter (cm) 6.1 cm  LV End Systolic Diameter (cm) 4.9 cm  LV Septum Wall Thickness (cm) 0.93 cm  LV Posterior Wall Thickness (cm) 0.88 cm  Left Atrium Diameter (cm) 4.2 cm  Result Narrative              [facility]        [place], [place]is Poag E      A member of [employer]     [id number]      [place] #:  [id number]                                Date: 01/18/2019 02: 07 PM      CardioPulmonary Services              Adult  Male  Age: 55 yrs                                Inpatient  ECHOCARDIOGRAM REPORT        DRH^^RERB                                MD1:    STUDY:CHEST WALL        TAPE:0000: 00: 0: 00: 00    ECHO:Yes  DOPPLER:Yes    FILE:0000-000-000    BP: 122/96 mmHg    COLOR:Yes  CONTRAST:No   MACHINE:Philips       HR: 105  RV BIOPSY:No     3D:No SOUND QLTY:Moderate      Height: 74 in   MEDIUM:None                       Weight: 178 lb                                BSA: 2.1 m2 _________________________________________________________________________________________         REASON: Assess        Atrial fibrillation with rapid ventricular response (CMS-HCC) [I48.91 (ICD-10-CM)] _________________________________________________________________________________________ ECHOCARDIOGRAPHIC MEASUREMENTS 2D DIMENSIONS AORTA         Values  Normal Range  MAIN PA     Values  Normal Range        Annulus: nm*     [2.3-2.9]     PA Main: nm*    [1.5-2.1]       Aorta Sin: 3.7 cm    [3.1-3.7]  RIGHT VENTRICLE      ST Junction: nm*     [2.6-3.2]     RV Base: nm*    [<4.2]       Asc.Aorta: nm*     [2.6-3.4]     RV Mid: nm*    [<3.5] LEFT VENTRICLE                   RV Length: nm*    [<8.6]         LVIDd: 6.1 cm    [4.2-5.9]  INFERIOR VENA CAVA         LVIDs: 4.9 cm            Max. IVC: nm*    [<=2.1]           FS: 19.4 %    [>25]      Min. IVC: nm*          SWT: 0.93 cm   [0.6-1.0]   ------------------          PWT: 0.88 cm   [0.6-1.0]  nm* - not measured LEFT ATRIUM        LA Diam: 4.2 cm    [3.0-4.0]      LA A4C Area: 23.6 cm2   [< 20]       LA Volume: 99.9 ml   [18-58] _________________________________________________________________________________________ ECHOCARDIOGRAPHIC DESCRIPTIONS AORTIC ROOT          Size: Normal       Dissection: INDETERM FOR DISSECTION AORTIC VALVE        Leaflets: Tricuspid          Morphology: Normal        Mobility: Fully mobile LEFT VENTRICLE          Size: MILDLY ENLARGED        Anterior: HYPOCONTRACTILE      Contraction: SEVERE GLOBAL DECREASE     Lateral: HYPOCONTRACTILE  Closest EF:<15% (Estimated)        Septal: HYPOCONTRACTILE       LV Masses: No Masses            Apical: HYPOCONTRACTILE          LVH: None             Inferior: HYPOCONTRACTILE                           Posterior: HYPOCONTRACTILE      Dias.FxClass: N/A MITRAL VALVE        Leaflets: Normal            Mobility: Fully mobile       Morphology: Normal LEFT ATRIUM          Size: MILDLY ENLARGED       LA Masses: No masses       IA Septum: Normal IAS MAIN PA          Size: Normal PULMONIC VALVE       Morphology: Normal            Mobility: Fully mobile RIGHT VENTRICLE       RV Masses: No Masses             Size: Normal       Free Wall: Normal           Contraction: Normal TRICUSPID VALVE        Leaflets: Normal            Mobility: Fully mobile       Morphology: Normal RIGHT ATRIUM          Size: Normal            RA Other: None        RA Mass: No masses PERICARDIUM         Fluid: No  effusion _________________________________________________________________________________________  DOPPLER ECHO and OTHER SPECIAL PROCEDURES         Aortic: MILD AR          No AS         Mitral: MILD MR          No MS             MV Inflow E Vel = nm*   MV Annulus E'Vel = nm*             E/E'Ratio = nm*       Tricuspid: MILD TR          No TS       Pulmonary: MILD PR          No PS _________________________________________________________________________________________ INTERPRETATION SEVERE LV SYSTOLIC DYSFUNCTION (LVEF <20%) NO SIGNIFICANT VALVULAR DYSFUNCTION (MILD TO MODERATE TR) NORMAL RV SIZE AND FUNCTION MILD PULMONARY HYPERTENSION NO PRIOR FOR COMPARISON _________________________________________________________________________________________ Electronically signed by Leonia Reeves, MD on 01/18/2019 02: 55 PM      Performed By: Morrie Sheldon, [place]   Ordering Physician: [facility], JEFFREY _________________________________________________________________________________________     Laboratory Data:  Chemistry Recent Labs  Lab 02/26/2019 1743 02/02/19 0424  NA 138 143  K 3.8 4.7  CL 108 112*  CO2 18* 22  GLUCOSE 208* 142*  BUN 30* 40*  CREATININE 1.78* 2.15*  CALCIUM 8.3* 8.3*  GFRNONAA 36* 28*  GFRAA 41* 33*  ANIONGAP 12 9    Recent Labs  Lab 02/17/2019 1743 02/02/19 0424  PROT 5.7* 5.8*  ALBUMIN 3.0* 3.2*  AST 43* 69*  ALT 34 60*  ALKPHOS 89 98  BILITOT 1.1 2.3*   Hematology Recent Labs  Lab 02/10/2019 1743 02/02/19 0424  WBC 12.9* 16.5*  RBC 4.84 5.04  HGB 13.5 14.0  HCT 42.9 44.2  MCV 88.6 87.7  MCH 27.9 27.8  MCHC 31.5 31.7  RDW 13.9 14.1  PLT 233 280   Cardiac Enzymes Recent Labs  Lab 02/12/2019 1743 02/10/2019 2041 02/12/2019 2112 02/02/19 0105 02/02/19 0424 02/02/19 0657  TROPONINI 0.09* 0.22* 0.30* 0.39* 0.34* 0.30*   No results for  input(s): TROPIPOC in the last 168 hours.  BNP Recent Labs  Lab 02/02/19 0424  BNP 3,727.0*    DDimer No results for input(s): DDIMER in the last 168 hours.  Radiology/Studies:  Dg Abdomen 1 View  Result Date: 02/04/2019 CLINICAL DATA:  NG tube placement. EXAM: ABDOMEN - 1 VIEW COMPARISON:  None. FINDINGS: External pacer paddles are noted. The NG tube tip is in the midesophagus and needs to be advanced several cm. Scattered air in the small bowel and colon. No obvious free air. IMPRESSION: The NG tube is in the mid esophagus and should be advanced several cm. Electronically Signed   By: Marijo Sanes M.D.   On: 03/01/2019 18:40   Ct Head Wo Contrast  Result Date: 03/01/2019 CLINICAL DATA:  Positive loss of consciousness. EXAM: CT HEAD WITHOUT CONTRAST TECHNIQUE: Contiguous axial images were obtained from the base of the skull through the vertex without intravenous contrast. COMPARISON:  None. FINDINGS: Brain: Mild chronic ischemic white matter disease is noted. No mass effect or midline shift is noted. Ventricular size is within normal limits. There is no evidence of mass lesion, hemorrhage or acute infarction. Vascular: No hyperdense vessel or unexpected calcification. Skull: Normal. Negative for fracture or focal lesion. Sinuses/Orbits: No acute finding. Other: None. IMPRESSION: Mild chronic ischemic white matter disease. No acute intracranial abnormality seen. Electronically Signed   By: Marijo Conception M.D.   On: 02/15/2019 20:09   Dg Chest Port 1 View  Result Date: 02/02/2019 CLINICAL DATA:  Central line placement EXAM: PORTABLE CHEST 1 VIEW COMPARISON:  02/02/2019 FINDINGS: The endotracheal tube terminates above the carina by 3 cm. There is a newly placed right-sided central venous catheter with tip projecting over the SVC. There is no evidence of a right-sided pneumothorax. The enteric tube remains malpositioned. The heart size is enlarged. There are prominent interstitial lung markings.  Bilateral rib fractures are again noted. IMPRESSION: 1. Well-positioned central venous catheter without evidence of a pneumothorax. 2. Endotracheal tube as above. 3. OG tube is malpositioned.  Repositioning is recommended. 4. Stable additional findings as above. Electronically Signed   By: Constance Holster M.D.   On: 02/02/2019 00:07   Dg Chest Port 1 View  Result Date: 02/06/2019 CLINICAL DATA:  Intubation EXAM: PORTABLE CHEST 1 VIEW COMPARISON:  02/16/2019 FINDINGS: The endotracheal tube terminates approximately 3.4 cm above the carina. The enteric tube is malpositioned in terminates at the level of the mid esophagus. The heart size is enlarged. Diffuse interstitial lung markings are again noted. There is no pneumothorax. No large pleural effusion. No acute osseous abnormality. There are probable acute displaced left-sided rib fractures. There are probable multiple displaced right-sided rib fractures. IMPRESSION: 1. Endotracheal tube terminates 3.4 cm above the carina. 2. OG tube terminates at the level of the mid esophagus. Repositioning is recommended. 3. Cardiomegaly with worsening interstitial lung markings concerning for progressive pulmonary edema/volume overload. 4. Acute bilateral rib fractures are noted,  likely related to CPR. Electronically Signed   By: Constance Holster M.D.   On: 02/10/2019 20:36   Dg Chest Portable 1 View  Result Date: 02/21/2019 CLINICAL DATA:  Intubation. EXAM: PORTABLE CHEST 1 VIEW COMPARISON:  None. FINDINGS: The heart is mildly enlarged. The mediastinal and hilar contours are within normal limits. The endotracheal tube is 5.3 cm above the carina. The NG tube is in the mid esophagus and needs to be advanced several cm. The lungs are clear of acute process. No infiltrates or effusions. No pneumothorax. The bony thorax is intact. IMPRESSION: Mild cardiac enlargement but no significant pulmonary findings. The ET tube is 5.3 cm above the carina. The NG tube is in the mid  esophagus and should be advanced several cm. Electronically Signed   By: Marijo Sanes M.D.   On: 01/31/2019 18:42    Assessment and Plan:   VF with Cardiac Arrest and ROSC - Cardiac arrest with ROSC. VF documented by EMS. CPR with atropine and shock. EMS reported VF once arrived on the scene. Estimated down time 5-77m Consider possible torsades prior to arrest given long QT as below with patient on Tikosyn. Unresponsive s/p arrest and now intubated with current hypothermia protocol. - No prior cardiac history or workup before his [facility] hospitalization 12/2018 for new onset Afib with RVR as in HPI. [place] [facility] [place] with EF less than 15% and presumed at this time to be NICM and pending ischemic workup. Consider worsening HFrEF d/t uncontrolled Afib with RVR as below. -  No current plans for ischemic workup / cardiac catheterization given he is unstable / unresponsive. Consider future outpatient workup once stable if renal function allows.  -  Troponin peaked 0.34, now down-trending and in the setting of cardiac arrest. -  Will contact EP regarding future evaluation to determine if candidate for ICD placement given VF and arrest.  -  Holding Toprol d/t bradycardia and hypotension. Labile pressures and rates since admitted. -  Daily BMET. Check Mg. Monitor K. Cr elevated from 6/2 Cr 1.4 following arrest. -  No Tikosyn recommended given long QT/QTc. -  Avoid digoxin given current renal function. -  Avoid amiodarone at this time given elevated liver enzymes.  -  Will obtain repeat echo following arrest. If repeat echo continues to show low EF, per MD, could consider milrinone.  -  Check TSH as below given recent h/o L thyroid mass as above in HPI. Check A1C. -  Plan to contact wife for update and to inquire after further cardiac history, in the event that a previous cardiac catheterization was performed but not available in EMR.  Afib with RVR (12/2018) [facility]. - Loaded with Tikosyn after failed DCCV with repeat DCCV holding SR as above. - Atrial tachycardia noted overnight on telemetry but not present on repeat EKG. - No Tikosyn given long QT as above. - Holding BB d/t labile BP and rates.  - Holding Eliquis / anticoagulation per neuro and pending further imaging to r/o hemorrhagic stroke. Recommend restart if no acute bleeding found. Hgb stable on CBC. - Check Mg as above. K 3.8 on 6/2. Currently 4.7. Continue to monitor.   HFrEF (EF<15%) - Presumed NICM at this time with no prior cardiac ischemic workup. Given no previous cath per EMR, future outpatient catheterization is recommended once he is stable and if renal function allows.  - BNP elevated on admission to 3727.0. - Previous echos  as copied and pasted above with EF less than 15%. Pending updated echo. - Consider worsening heart failure in the setting of uncontrolled rate d/t Afib with RVR. - PTA Toprol and benazepril held d/t renal function, bradycardia, and hypotension. Continue to hold. - Given low EF on previous echos, caution with fluids. - May need to consider milrinone if repeat echo with EF still low, per MD.  HTN - Continue holding PTA benazepril and Toprol as above with low BP.  HLD - Continue to hold PTA pravastatin 92m daily given liver function at admission.  AKI with CKDIII - Cr elevated from baseline and following arrest. - Caution with nephrotoxins.  - Monitor potassium as elevated as of most recent BMET - Will check Mg.  - Consider nephrology consultation.   Hyperthyroidism - Check TSH - H/o L thryoid mass as in HPI. - Consider as trigger for Afib with RVR - Per endocrinology, PCP.   For questions or updates, please contact CMarlowPlease consult [email or web address] for contact info under     Signed, JArvil Chaco PA-C  02/02/2019 7:50 AM

## 2019-02-03 ENCOUNTER — Telehealth: Payer: Self-pay | Admitting: Physician Assistant

## 2019-02-03 ENCOUNTER — Other Ambulatory Visit: Payer: Self-pay

## 2019-02-03 DIAGNOSIS — E0590 Thyrotoxicosis, unspecified without thyrotoxic crisis or storm: Secondary | ICD-10-CM

## 2019-02-03 DIAGNOSIS — I4901 Ventricular fibrillation: Secondary | ICD-10-CM

## 2019-02-03 DIAGNOSIS — I469 Cardiac arrest, cause unspecified: Secondary | ICD-10-CM

## 2019-02-03 DIAGNOSIS — I480 Paroxysmal atrial fibrillation: Secondary | ICD-10-CM

## 2019-02-03 LAB — COMPREHENSIVE METABOLIC PANEL
ALT: 68 U/L — ABNORMAL HIGH (ref 0–44)
AST: 67 U/L — ABNORMAL HIGH (ref 15–41)
Albumin: 2.3 g/dL — ABNORMAL LOW (ref 3.5–5.0)
Alkaline Phosphatase: 175 U/L — ABNORMAL HIGH (ref 38–126)
Anion gap: 8 (ref 5–15)
BUN: 40 mg/dL — ABNORMAL HIGH (ref 8–23)
CO2: 16 mmol/L — ABNORMAL LOW (ref 22–32)
Calcium: 6.8 mg/dL — ABNORMAL LOW (ref 8.9–10.3)
Chloride: 120 mmol/L — ABNORMAL HIGH (ref 98–111)
Creatinine, Ser: 1.91 mg/dL — ABNORMAL HIGH (ref 0.61–1.24)
GFR calc Af Amer: 38 mL/min — ABNORMAL LOW (ref >60)
GFR calc non Af Amer: 33 mL/min — ABNORMAL LOW (ref >60)
Glucose, Bld: 86 mg/dL (ref 70–99)
Potassium: 3.7 mmol/L (ref 3.5–5.1)
Sodium: 144 mmol/L (ref 135–145)
Total Bilirubin: 3 mg/dL — ABNORMAL HIGH (ref 0.3–1.2)
Total Protein: 4.3 g/dL — ABNORMAL LOW (ref 6.5–8.1)

## 2019-02-03 LAB — GLUCOSE, CAPILLARY
Glucose-Capillary: 105 mg/dL — ABNORMAL HIGH (ref 70–99)
Glucose-Capillary: 106 mg/dL — ABNORMAL HIGH (ref 70–99)
Glucose-Capillary: 77 mg/dL (ref 70–99)
Glucose-Capillary: 86 mg/dL (ref 70–99)
Glucose-Capillary: 91 mg/dL (ref 70–99)

## 2019-02-03 LAB — URINE CULTURE
Culture: NO GROWTH
Special Requests: NORMAL

## 2019-02-03 LAB — MAGNESIUM: Magnesium: 2.1 mg/dL (ref 1.7–2.4)

## 2019-02-03 LAB — CBC WITH DIFFERENTIAL/PLATELET
Abs Immature Granulocytes: 0.11 10*3/uL — ABNORMAL HIGH (ref 0.00–0.07)
Basophils Absolute: 0 10*3/uL (ref 0.0–0.1)
Basophils Relative: 0 %
Eosinophils Absolute: 0.1 10*3/uL (ref 0.0–0.5)
Eosinophils Relative: 0 %
HCT: 42.3 % (ref 39.0–52.0)
Hemoglobin: 13.5 g/dL (ref 13.0–17.0)
Immature Granulocytes: 1 %
Lymphocytes Relative: 5 %
Lymphs Abs: 0.9 10*3/uL (ref 0.7–4.0)
MCH: 28.1 pg (ref 26.0–34.0)
MCHC: 31.9 g/dL (ref 30.0–36.0)
MCV: 87.9 fL (ref 80.0–100.0)
Monocytes Absolute: 1.2 10*3/uL — ABNORMAL HIGH (ref 0.1–1.0)
Monocytes Relative: 6 %
Neutro Abs: 16.7 10*3/uL — ABNORMAL HIGH (ref 1.7–7.7)
Neutrophils Relative %: 88 %
Platelets: 189 10*3/uL (ref 150–400)
RBC: 4.81 MIL/uL (ref 4.22–5.81)
RDW: 14.6 % (ref 11.5–15.5)
WBC: 19 10*3/uL — ABNORMAL HIGH (ref 4.0–10.5)
nRBC: 0 % (ref 0.0–0.2)

## 2019-02-03 LAB — THYROID PANEL WITH TSH
Free Thyroxine Index: 2.5 (ref 1.2–4.9)
T3 Uptake Ratio: 35 % (ref 24–39)
T4, Total: 7 ug/dL (ref 4.5–12.0)
TSH: <0.006 u[IU]/mL — ABNORMAL LOW (ref 0.450–4.500)

## 2019-02-03 LAB — PROCALCITONIN: Procalcitonin: 3.26 ng/mL

## 2019-02-03 LAB — HEPARIN LEVEL (UNFRACTIONATED): Heparin Unfractionated: 0.52 IU/mL (ref 0.30–0.70)

## 2019-02-03 LAB — APTT
aPTT: 41 seconds — ABNORMAL HIGH (ref 24–36)
aPTT: 74 seconds — ABNORMAL HIGH (ref 24–36)

## 2019-02-03 MED ORDER — AMIODARONE LOAD VIA INFUSION: 150.0000 mg | Freq: Once | INTRAVENOUS | Status: DC

## 2019-02-03 MED ORDER — VITAL HIGH PROTEIN PO LIQD: 1000.0000 mL | ORAL | Status: DC

## 2019-02-03 MED ORDER — HEPARIN BOLUS VIA INFUSION
4000.0000 [IU] | Freq: Once | INTRAVENOUS | Status: AC
  Administered 2019-02-03: 4000 [IU] via INTRAVENOUS
  Filled 2019-02-03: qty 4000

## 2019-02-03 MED ORDER — SODIUM CHLORIDE 0.9 % IV SOLN
2.0000 g | Freq: Every day | INTRAVENOUS | Status: DC
  Administered 2019-02-03 – 2019-02-04 (×2): 2 g via INTRAVENOUS
  Filled 2019-02-03 (×2): qty 2

## 2019-02-03 MED ORDER — PRO-STAT SUGAR FREE PO LIQD
30.0000 mL | Freq: Every day | ORAL | Status: DC
  Administered 2019-02-03 – 2019-02-04 (×2): 30 mL

## 2019-02-03 MED ORDER — PANTOPRAZOLE SODIUM 40 MG PO PACK: 40.0000 mg | PACK | Freq: Every day | ORAL | Status: DC

## 2019-02-03 MED ORDER — VITAL HIGH PROTEIN PO LIQD
1000.0000 mL | ORAL | Status: DC
  Administered 2019-02-03: 1000 mL

## 2019-02-03 MED ORDER — POTASSIUM CHLORIDE 20 MEQ PO PACK
40.0000 meq | PACK | Freq: Once | ORAL | Status: AC
  Administered 2019-02-03: 40 meq
  Filled 2019-02-03: qty 2

## 2019-02-03 MED ORDER — AMIODARONE HCL IN DEXTROSE 360-4.14 MG/200ML-% IV SOLN
60.0000 mg/h | INTRAVENOUS | Status: DC
  Administered 2019-02-03 (×3): 60 mg/h via INTRAVENOUS
  Administered 2019-02-04: 30 mg/h via INTRAVENOUS
  Administered 2019-02-04: 60 mg/h via INTRAVENOUS
  Filled 2019-02-03 (×3): qty 200

## 2019-02-03 MED ORDER — MIDAZOLAM HCL 2 MG/2ML IJ SOLN: 1.0000 mg | INTRAMUSCULAR | Status: DC | PRN

## 2019-02-03 MED ORDER — AMIODARONE HCL IN DEXTROSE 360-4.14 MG/200ML-% IV SOLN
INTRAVENOUS | Status: AC
  Administered 2019-02-03: 150 mg via INTRAVENOUS
  Filled 2019-02-03: qty 200

## 2019-02-03 MED ORDER — HEPARIN (PORCINE) 25000 UT/250ML-% IV SOLN
1350.0000 [IU]/h | INTRAVENOUS | Status: DC
  Administered 2019-02-03 – 2019-02-04 (×2): 1200 [IU]/h via INTRAVENOUS
  Filled 2019-02-03 (×2): qty 250

## 2019-02-03 MED ORDER — AMIODARONE LOAD VIA INFUSION
150.0000 mg | Freq: Once | INTRAVENOUS | Status: AC
  Administered 2019-02-03: 09:00:00 150 mg via INTRAVENOUS

## 2019-02-03 MED ORDER — VITAL 1.5 CAL PO LIQD
1000.0000 mL | ORAL | Status: DC
  Administered 2019-02-03 – 2019-02-04 (×2): 1000 mL

## 2019-02-03 MED ORDER — AMIODARONE IV BOLUS ONLY 150 MG/100ML
INTRAVENOUS | Status: AC
  Administered 2019-02-03: 150 mg
  Filled 2019-02-03: qty 100

## 2019-02-03 MED ORDER — DIGOXIN 0.25 MG/ML IJ SOLN
0.2500 mg | Freq: Once | INTRAMUSCULAR | Status: AC
  Administered 2019-02-03: 0.25 mg via INTRAVENOUS
  Filled 2019-02-03: qty 2

## 2019-02-03 MED ORDER — AMIODARONE HCL IN DEXTROSE 360-4.14 MG/200ML-% IV SOLN
60.0000 mg/h | INTRAVENOUS | Status: AC
  Administered 2019-02-03: 60 mg/h via INTRAVENOUS
  Filled 2019-02-03 (×2): qty 200

## 2019-02-03 MED ORDER — DOXYCYCLINE HYCLATE 100 MG PO TABS
100.0000 mg | ORAL_TABLET | Freq: Two times a day (BID) | ORAL | Status: DC
  Administered 2019-02-03 – 2019-02-04 (×3): 100 mg
  Filled 2019-02-03 (×3): qty 1

## 2019-02-03 MED ORDER — METHIMAZOLE 10 MG PO TABS
20.0000 mg | ORAL_TABLET | Freq: Two times a day (BID) | ORAL | Status: DC
  Administered 2019-02-03 – 2019-02-04 (×3): 20 mg
  Filled 2019-02-03 (×4): qty 2

## 2019-02-03 NOTE — Progress Notes (Signed)
[employer] [facility] - [place] at [facility]   PATIENT NAME: Tracy Fitzpatrick    MR#:  [id number]  DATE OF BIRTH:  1941/06/20  SUBJECTIVE:  CHIEF COMPLAINT:   Chief Complaint  Patient presents with  . Loss of Consciousness   Patient sedated on the vent Patient went into atrial fibrillation with rapid ventricular response.  Seen by cardiologist and started on amiodarone drip today.  REVIEW OF SYSTEMS:  ROS Unobtainable  DRUG ALLERGIES:   Allergies  Allergen Reactions  . Penicillins Other (See Comments)    Other Reaction: fainting, Other reaction  . Latex Rash   VITALS:  Blood pressure (!) 111/95, pulse 68, temperature 99.9 F (37.7 C), temperature source Bladder, resp. rate (!) 25, height 6\' 2"  (1.88 m), weight 77.8 kg, SpO2 93 %. PHYSICAL EXAMINATION:   Physical Exam  Constitutional: He appears well-developed.  Sedated on the vent  HENT:  Head: Normocephalic and atraumatic.  Eyes: Pupils are equal, round, and reactive to light. Right eye exhibits no discharge.  Neck: Normal range of motion. No tracheal deviation present.  Cardiovascular: Normal rate, regular rhythm and normal heart sounds.  Respiratory: Effort normal.  Sedated on the vent  GI: Soft. Bowel sounds are normal. He exhibits no distension.  Musculoskeletal:        General: No deformity or edema.  Neurological:  Sedated on the vent  Skin: No rash noted. No erythema.  Psychiatric:  Sedated on the vent   LABORATORY PANEL:  Male CBC Recent Labs  Lab 02/03/19 0505  WBC 19.0*  HGB 13.5  HCT 42.3  PLT 189   ------------------------------------------------------------------------------------------------------------------ Chemistries  Recent Labs  Lab 02/03/19 0505 02/03/19 0930  NA 144  --   K 3.7  --   CL 120*  --   CO2 16*  --   GLUCOSE 86  --   BUN 40*  --   CREATININE 1.91*  --   CALCIUM 6.8*  --   MG  --  2.1  AST 67*  --   ALT 68*  --   ALKPHOS 175*  --   BILITOT 3.0*  --     RADIOLOGY:  No results found. ASSESSMENT AND PLAN:   78 y.o. male with a known history of atrial fibrillation on Eliquis, CKD stage III, hypertension, hyperlipidemia, GERD, arthritis, prediabetes, prostate cancer and CHF presenting to the ED post witnessed cardiac arrest.  1.  Cardiac arrest  - patient presented with witnessed V. fib arrest post ACLS with ROSC  - Admitted to ICU - Mechanical ventilation - CT head no acute findings - Management per ICU team  2. Elevated troponins - Status post cardiac arrest and ACLS - Trending troponin . Follow up cardiology input and Echo  3. Systolic Congestive Heart Failure   Last Echo 01/18/19 EF 15% - Repeat Echo post cardiac arrest - On metoprolol and Lotensin held for low BP  3. 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 Atrial fibrillation - Patient was on Eliquis prior to admission.  Currently on hold due to patient being on the vent.  Cardiology managing.  Started on amiodarone drip for atrial fibrillation with rapid regular response today. Already started on heparin drip for anticoagulation as well.  5. Hypertension -now hypotensive in the setting of cardiac arrest - Holding all home BP meds  6. CKD (chronic kidney disease) stage 3, GFR 30-59 ml/min  Slight improvement in renal function today. -  Hold nephrotoxins  DVT proph: Heparin   All the records are reviewed and case discussed with Care Management/Social Worker  CODE STATUS: DNR ICU physician discussed with family and patient has been made DNR  TOTAL TIME TAKING CARE OF THIS PATIENT: 36 minutes.   More than 50% of the time was spent in counseling/coordination of care: YES  POSSIBLE D/C IN 3 DAYS, DEPENDING ON CLINICAL CONDITION.   Anayia Eugene M.D on 02/03/2019 at 3:08 PM  Between 7am to 6pm - Pager - [phone]  After 6pm go to [email or web address] - Social research officer, governmentpassword EPAS [facility]  [facility] [place]  [facility]  Office  [phone]  CC: Primary care physician; Rayetta HumphreyGeorge, Sionne A, MD  Note: This dictation was prepared with Dragon dictation along with smaller phrase technology. Any transcriptional errors that result from this process are unintentional.

## 2019-02-03 NOTE — Progress Notes (Signed)
Pharmacy Electrolyte Monitoring Consult:  Pharmacy consulted to assist in monitoring and replacing electrolytes in this 78 y.o. male admitted on 02-28-2019 admitted s/p cardiac arrest from NSTEMI. Patient underwent targeted temperature management and has now been rewarmed.   Labs:  Sodium (mmol/L)  Date Value  02/03/2019 144   Potassium (mmol/L)  Date Value  02/03/2019 3.7   Magnesium (mg/dL)  Date Value  40/76/8088 2.1   Calcium (mg/dL)  Date Value  07/04/1593 6.8 (L)   Albumin (g/dL)  Date Value  58/59/2924 2.3 (L)   Corrected Calcium: 8.2   Assessment/Plan: Potassium VT x 1.   Will replace for goal potassium ~4 and goal magnesium ~ 2.   Will obtain electrolytes with am labs.   Pharmacy will continue to monitor and adjust per consult.   Simpson,Michael L 02/03/2019 4:21 PM

## 2019-02-03 NOTE — Progress Notes (Addendum)
Per Dr Mariah Milling we will continue to run amiodarone at 60 mg/hr for now as pt continues with AFib @ 120 BPM  Pt blinks his eyes when his forehead or eyelashes are touched, but does not respons at all to visual threat.  He does not appear to see hand coming toward his face, although his eyes are open.  Gaze tends to be straight upward, bilaterally. Propofol off most of shift, no levophed needed.  Appears comfortable on 150 mcgs fentanyl.  Afib continues despite amiodarone boluses x 2 and continuous infusion at 60 mgs/hr and one dose 250 mcg IV Digoxin.  Heart Rate avg 130.  Breathing over the vent approx 5-6/minute.  ALine pressures 120s over 50-60s.  He has been mildly febrile - mildly hypothermic this shift.  Current temp is 37.3.  UOP 250 for shift.  TFs started per dietician order.

## 2019-02-03 NOTE — Progress Notes (Signed)
Initial Nutrition Assessment  RD working remotely.  DOCUMENTATION CODES:   Not applicable  INTERVENTION:  Initiate Vital 1.5 Cal at 15 mL/hr and advance by 20 mL/hr every 8 hours to goal rate of 55 mL/hr (1320 mL goal daily volume). Also provide Pro-Stat 30 mL once daily. Provides 2080 kcal, 104 grams of protein, 1003 mL H2O daily.  Provide minimum free water flush of 30 mL Q4hrs to maintain tube patency.   NUTRITION DIAGNOSIS:   Inadequate oral intake related to inability to eat as evidenced by NPO status.  GOAL:   Provide needs based on ASPEN/SCCM guidelines  MONITOR:   Vent status, Labs, Weight trends, TF tolerance, Skin, I & O's  REASON FOR ASSESSMENT:   Ventilator, Consult Enteral/tube feeding initiation and management  ASSESSMENT:   78 year old male admitted with acute cardiac arrest from NSTEMI with severe end stage CHF (LVEF 20%) and acute renal failure. Patient was intubated on 6/2 and is s/p 36C TTM protocol.   Patient intubated and sedated. On PRVC mode with FiO2 30% and PEEP 5 cmH2O. Abdomen is soft per RN documentation. Last BM unknown/PTA. No weight trend in chart.  Enteral Access: 18 Fr. NGT placed 6/3; terminates in stomach per abdominal x-ray 6/3; 70 cm at right nare  MAP: 62-100 mmHg  Patient is currently intubated on ventilator support Ve: 16.1 L/min Temp (24hrs), Avg:98 F (36.7 C), Min:96.1 F (35.6 C), Max:99.9 F (37.7 C)  Propofol: N/A  Medications reviewed and include: doxycycline 100 mg Q12hrs per tube, Novolog 0-15 units Q4hrs, pantoprazole, amiodarone, ceftriaxone, fentanyl gtt, heparin gtt, norepinephrine gtt now off, propofol gtt now off.  Labs reviewed: CBG 77-106, Chloride 120, CO2 16, BUN 40, Creatinine 1.91.  I/O: 915 mL UOP yesterday (0.5 mL/kg/hr)  NUTRITION - FOCUSED PHYSICAL EXAM:  Unable to complete at this time.  Diet Order:   Diet Order            Diet NPO time specified  Diet effective now              EDUCATION NEEDS:   No education needs have been identified at this time  Skin:  Skin Assessment: Reviewed RN Assessment  Last BM:  Unknown/PTA  Height:   Ht Readings from Last 1 Encounters:  03/01/19 6\' 2"  (1.88 m)   Weight:   Wt Readings from Last 1 Encounters:  03/01/2019 77.8 kg   Ideal Body Weight:     BMI:  Body mass index is 22.02 kg/m.  Estimated Nutritional Needs:   Kcal:  2093 (PSU 2003b w/ MSJ 1573, Ve 16.1, Tmax 37.7)  Protein:  95-115 grams (1.2-1.5 grams/kg)  Fluid:  1.9 L/day (25 mL/kg)  Helane Rima, MS, RD, LDN Office: [phone] Pager: [phone] After Hours/Weekend Pager: [phone]

## 2019-02-03 NOTE — Telephone Encounter (Signed)
   Spoke with patient's daughter and wife to update from a cardiac standpoint as of 02/03/2019.    All questions were answered and both daughter and wife were appreciative of the update.  Signed, Lennon Alstrom, PA-C 02/03/2019, 4:16 PM Pager [phone]

## 2019-02-03 NOTE — Progress Notes (Signed)
Progress Note  Patient Name: Tracy Fitzpatrick Date of Encounter: 02/03/2019  Primary Cardiologist: Joya Salm [facility] - Dr. Rockey Situ  Subjective   Remains intubated / on ventilator since cardiac arrest.  Rewarming now after hypothermia protocol.  Back in atrial fibrillation this morning with rapid ventricular rate and started on amiodarone.  Inpatient Medications    Scheduled Meds:  chlorhexidine gluconate (MEDLINE KIT)  15 mL Mouth Rinse BID   Chlorhexidine Gluconate Cloth  6 each Topical Q0600   heparin  4,000 Units Intravenous Once   insulin aspart  0-15 Units Subcutaneous Q4H   mouth rinse  15 mL Mouth Rinse 10 times per day   pantoprazole (PROTONIX) IV  40 mg Intravenous Daily   Continuous Infusions:  sodium chloride Stopped (02/02/19 2249)   amiodarone 60 mg/hr (02/03/19 0830)   Followed by   amiodarone     fentaNYL infusion INTRAVENOUS 100 mcg/hr (02/03/19 0622)   heparin     norepinephrine (LEVOPHED) Adult infusion 1 mcg/min (02/03/19 0622)   propofol (DIPRIVAN) infusion 20 mcg/kg/min (02/03/19 0634)   PRN Meds: sodium chloride, fentaNYL, ipratropium-albuterol, vecuronium   Vital Signs    Vitals:   02/03/19 0530 02/03/19 0545 02/03/19 0600 02/03/19 1202  BP: (!) 85/48 (!) 83/58 (!) 85/57   Pulse: 63 64 63   Resp: '17 16 17   ' Temp:   97.7 F (36.5 C)   TempSrc:   Bladder   SpO2: 97% 96% 95% 93%  Weight:      Height:        Intake/Output Summary (Last 24 hours) at 02/03/2019 1253 Last data filed at 02/03/2019 0622 Gross per 24 hour  Intake 539.54 ml  Output 875 ml  Net -335.46 ml   Filed Weights   02/11/2019 2006  Weight: 77.8 kg    Telemetry    Afib with ventricular rates into the high 130s  - Personally Reviewed  ECG    Afib - Personally Reviewed  Physical Exam   GEN: Sedated and on vent.  Neck: Unable to estimate JVD Cardiac: IRIR / tachycardic rate, no murmurs, rubs, or gallops.  Respiratory: Intubated and on vent with coarse breath  sounds bilaterally. GI: Soft, nontender, non-distended  MS: No bilateral lower extremity edema; No deformity. Neuro:  Sedated Psych: Sedated  Labs    Chemistry Recent Labs  Lab 02/16/2019 1743 02/02/19 0424 02/02/19 1321 02/03/19 0505  NA 138 143 140 144  K 3.8 4.7 4.4 3.7  CL 108 112* 110 120*  CO2 18* 22 19* 16*  GLUCOSE 208* 142* 119* 86  BUN 30* 40* 46* 40*  CREATININE 1.78* 2.15* 2.32* 1.91*  CALCIUM 8.3* 8.3* 8.2* 6.8*  PROT 5.7* 5.8*  --  4.3*  ALBUMIN 3.0* 3.2*  --  2.3*  AST 43* 69*  --  67*  ALT 34 60*  --  68*  ALKPHOS 89 98  --  175*  BILITOT 1.1 2.3*  --  3.0*  GFRNONAA 36* 28* 26* 33*  GFRAA 41* 33* 30* 38*  ANIONGAP '12 9 11 8     ' Hematology Recent Labs  Lab 02/07/2019 1743 02/02/19 0424 02/03/19 0505  WBC 12.9* 16.5* 19.0*  RBC 4.84 5.04 4.81  HGB 13.5 14.0 13.5  HCT 42.9 44.2 42.3  MCV 88.6 87.7 87.9  MCH 27.9 27.8 28.1  MCHC 31.5 31.7 31.9  RDW 13.9 14.1 14.6  PLT 233 280 189    Cardiac Enzymes Recent Labs  Lab 02/02/19 0105 02/02/19 0424 02/02/19 3013  02/02/19 1321  TROPONINI 0.39* 0.34* 0.30* 0.21*   No results for input(s): TROPIPOC in the last 168 hours.   BNP Recent Labs  Lab 02/02/19 0424  BNP 3,727.0*     DDimer No results for input(s): DDIMER in the last 168 hours.   Radiology    Dg Abd 1 View  Result Date: 02/02/2019 CLINICAL DATA:  Check gastric catheter placement EXAM: ABDOMEN - 1 VIEW COMPARISON:  01/31/2019 FINDINGS: Gastric catheter has now reached the stomach. Scattered large and small bowel gas is noted. IMPRESSION: Gastric catheter within the stomach. Electronically Signed   By: Inez Catalina M.D.   On: 02/02/2019 09:37   Dg Abdomen 1 View  Result Date: 02/13/2019 CLINICAL DATA:  NG tube placement. EXAM: ABDOMEN - 1 VIEW COMPARISON:  None. FINDINGS: External pacer paddles are noted. The NG tube tip is in the midesophagus and needs to be advanced several cm. Scattered air in the small bowel and colon. No obvious  free air. IMPRESSION: The NG tube is in the mid esophagus and should be advanced several cm. Electronically Signed   By: Marijo Sanes M.D.   On: 02/13/2019 18:40   Ct Head Wo Contrast  Result Date: 03/01/2019 CLINICAL DATA:  Positive loss of consciousness. EXAM: CT HEAD WITHOUT CONTRAST TECHNIQUE: Contiguous axial images were obtained from the base of the skull through the vertex without intravenous contrast. COMPARISON:  None. FINDINGS: Brain: Mild chronic ischemic white matter disease is noted. No mass effect or midline shift is noted. Ventricular size is within normal limits. There is no evidence of mass lesion, hemorrhage or acute infarction. Vascular: No hyperdense vessel or unexpected calcification. Skull: Normal. Negative for fracture or focal lesion. Sinuses/Orbits: No acute finding. Other: None. IMPRESSION: Mild chronic ischemic white matter disease. No acute intracranial abnormality seen. Electronically Signed   By: Marijo Conception M.D.   On: 02/16/2019 20:09   Dg Chest Port 1 View  Result Date: 02/02/2019 CLINICAL DATA:  Central line placement EXAM: PORTABLE CHEST 1 VIEW COMPARISON:  02/23/2019 FINDINGS: The endotracheal tube terminates above the carina by 3 cm. There is a newly placed right-sided central venous catheter with tip projecting over the SVC. There is no evidence of a right-sided pneumothorax. The enteric tube remains malpositioned. The heart size is enlarged. There are prominent interstitial lung markings. Bilateral rib fractures are again noted. IMPRESSION: 1. Well-positioned central venous catheter without evidence of a pneumothorax. 2. Endotracheal tube as above. 3. OG tube is malpositioned.  Repositioning is recommended. 4. Stable additional findings as above. Electronically Signed   By: Constance Holster M.D.   On: 02/02/2019 00:07   Dg Chest Port 1 View  Result Date: 02/11/2019 CLINICAL DATA:  Intubation EXAM: PORTABLE CHEST 1 VIEW COMPARISON:  02/18/2019 FINDINGS: The  endotracheal tube terminates approximately 3.4 cm above the carina. The enteric tube is malpositioned in terminates at the level of the mid esophagus. The heart size is enlarged. Diffuse interstitial lung markings are again noted. There is no pneumothorax. No large pleural effusion. No acute osseous abnormality. There are probable acute displaced left-sided rib fractures. There are probable multiple displaced right-sided rib fractures. IMPRESSION: 1. Endotracheal tube terminates 3.4 cm above the carina. 2. OG tube terminates at the level of the mid esophagus. Repositioning is recommended. 3. Cardiomegaly with worsening interstitial lung markings concerning for progressive pulmonary edema/volume overload. 4. Acute bilateral rib fractures are noted, likely related to CPR. Electronically Signed   By: Constance Holster M.D.   On:  02/04/2019 20:36   Dg Chest Portable 1 View  Result Date: 01/31/2019 CLINICAL DATA:  Intubation. EXAM: PORTABLE CHEST 1 VIEW COMPARISON:  None. FINDINGS: The heart is mildly enlarged. The mediastinal and hilar contours are within normal limits. The endotracheal tube is 5.3 cm above the carina. The NG tube is in the mid esophagus and needs to be advanced several cm. The lungs are clear of acute process. No infiltrates or effusions. No pneumothorax. The bony thorax is intact. IMPRESSION: Mild cardiac enlargement but no significant pulmonary findings. The ET tube is 5.3 cm above the carina. The NG tube is in the mid esophagus and should be advanced several cm. Electronically Signed   By: Marijo Sanes M.D.   On: 02/27/2019 18:42    Cardiac Studies   TTE 02/02/2019 IMPRESSIONS  1. The left ventricle has severely reduced systolic function, with an ejection fraction of <20%. The cavity size was mild to moderately dilated. Unable to exclude regional wall motion abnormality.  2. The right ventricle has normal systolic function. The cavity was normal. There is no increase in right ventricular  wall thickness. Right ventricular systolic pressure is moderately elevated with an estimated pressure of 53.0 mmHg.  3. Left atrial size was moderately dilated.  4. There is dilatation of the aortic root, 3.6 cm   TEE 01/20/2019 [place], [place] member of [place], [place] #: [id number]                                Date: 01/20/2019 08: 51 AM      CardioPulmonary Services              Adult  Male  Age: 78 yrs                                Inpatient    ECHOCARDIOGRAM REPORT                 DRH^^52                                MD1: ESCHBACH, JEFFREY                                CHARLES    STUDY:TRANSESOPHAGEAL     TAPE:0000: 00: 0: 00: 00    ECHO:Yes  DOPPLER:Yes    FILE:0000-000-000    BP: 114/89 mmHg    COLOR:Yes  CONTRAST:No   MACHINE:Philips       HR: 130  RV BIOPSY:No     3D:No SOUND QLTY:Moderate      Height: 74 in   MEDIUM:None                       Weight: 173 lb                                BSA: 2.0 m2  _________________________________________________________________________________________ _________________________________________________________________________________________ ANESTHESIA       Midazolam: Yes              Fentanyl: Yes        Propofol: Yes       Lidocaine: Yes       Intubation: Intubation accomplished without difficulty.     Complications: None, PROCEDURE Informed Consent was obtained from the patient prior to the procedure VALVE REGURGITATIONS           AR: No AR                PR: No  PR           MR: MILD MR               TR: No TR MITRAL VALVE        Leaflets: Normal           Morphology: Normal        Mobility: Fully Mobile AORTIC VALVE        AoCusps: Tricuspid          Morphology: Normal        Mobility: Fully Mobile TRICUSPID VALVE        Leaflets: Normal           Morphology: Normal        Mobility: Fully Mobile PULMONIC VALVE        Leaflets: Normal           Morphology: Normal        Mobility: Fully Mobile LEFT VENTRICLE          Size: Normal          Contractility: SEVERE GLOBAL DECREASE RIGHT VENTRICLE          Size: Normal            Free Wall: Normal PERICARDIUM/CONTRAST         Fluid: No effusion _________________________________________________________________________________________  CONCLUSIONS NO LEFT ATRIAL APPENDAGE THROMBUS MILD MR _________________________________________________________________________________________  Electronically signed by: Leonia Reeves, MDon 01/20/2019 12: 50 PM      Performed By: Leonia Reeves, MD   Ordering Physician: Alisia Ferrari _________________________________________________________________________________________    Lacy Duverney RCS  Other Result Information  Interface, Text Results In - 01/20/2019 12:50 PM EDT                                                              Version 2           [place], [place] member of [employer]         [id number]           [place], [place] #: [id number]  Date: 01/20/2019 08: 05 AM           CardioPulmonary Services                           Adult   Male   Age: 45 yrs                                                               Inpatient       ECHOCARDIOGRAM REPORT                                  DRH^^52                                                              MD1: ESCHBACH, JEFFREY                                                               CHARLES      STUDY:TRANSESOPHAGEAL          TAPE:0000: 00: 0: 00: 00       ECHO:Yes    DOPPLER:Yes       FILE:0000-000-000        BP: 114/89 mmHg      COLOR:Yes   CONTRAST:No     MACHINE:Philips             HR: 130  RV BIOPSY:No          3D:No  SOUND QLTY:Moderate            Height: 74 in     MEDIUM:None                                              Weight: 173 lb                                                              BSA: 2.0 m2 _________________________________________________________________________________________ _________________________________________________________________________________________ ANESTHESIA             Midazolam: Yes                           Fentanyl: Yes              Propofol: Yes             Lidocaine: Yes            Intubation: Intubation accomplished without difficulty.  Complications: None, PROCEDURE Informed Consent was obtained from the patient prior to the procedure VALVE REGURGITATIONS                    AR: No AR                               PR: No PR                    MR: MILD MR                             TR: No TR MITRAL VALVE              Leaflets: Normal                      Morphology: Normal              Mobility: Fully Mobile AORTIC VALVE               AoCusps: Tricuspid                   Morphology: Normal              Mobility: Fully Mobile TRICUSPID VALVE              Leaflets: Normal                      Morphology: Normal              Mobility: Fully Mobile PULMONIC VALVE              Leaflets: Normal                      Morphology: Normal              Mobility: Fully Mobile LEFT VENTRICLE                  Size: Normal                   Contractility: SEVERE GLOBAL DECREASE RIGHT  VENTRICLE                  Size: Normal                       Free Wall: Normal PERICARDIUM/CONTRAST                 Fluid: No effusion _________________________________________________________________________________________  CONCLUSIONS NO LEFT ATRIAL APPENDAGE THROMBUS MILD MR _________________________________________________________________________________________  Electronically signed by: Leonia Reeves, MDon 01/20/2019 12: 58 PM          Performed By: Leonia Reeves, MD    Ordering Physician: Alisia Ferrari _________________________________________________________________________________________      Lacy Duverney RCS     TTE 01/18/2019 LV Ejection Fraction (%) 15   Aortic Valve Stenosis Grade none   Aortic Valve Regurgitation Grade mild   Mitral Valve Stenosis Grade none   Mitral Valve Regurgitation Grade mild   Tricuspid Valve Regurgitation Grade mild   LV End Diastolic Diameter (cm) 6.1 cm  LV End Systolic Diameter (cm) 4.9 cm  LV Septum Wall Thickness (cm) 0.93 cm  LV Posterior Wall Thickness (cm) 0.88 cm  Left Atrium Diameter (cm) 4.2 cm  Result Narrative  [facility]        [place], [place] E      A member of [place], [place] #: [id number]                                Date: 01/18/2019 02: 07 PM      CardioPulmonary Services              Adult  Male  Age: 39 yrs                                Inpatient             ECHOCARDIOGRAM REPORT        DRH^^RERB                                MD1:    STUDY:CHEST WALL        TAPE:0000: 00: 0: 00: 00    ECHO:Yes  DOPPLER:Yes    FILE:0000-000-000    BP: 122/96 mmHg    COLOR:Yes  CONTRAST:No   MACHINE:Philips        HR: 105  RV BIOPSY:No     3D:No SOUND QLTY:Moderate      Height: 74 in   MEDIUM:None                       Weight: 178 lb                                BSA: 2.1 m2 _________________________________________________________________________________________         REASON: Assess        Atrial fibrillation with rapid ventricular response (CMS-HCC) [I48.91 (ICD-10-CM)] _________________________________________________________________________________________ ECHOCARDIOGRAPHIC MEASUREMENTS 2D DIMENSIONS AORTA         Values  Normal Range  MAIN PA     Values  Normal Range        Annulus: nm*     [2.3-2.9]     PA Main: nm*    [1.5-2.1]       Aorta Sin: 3.7 cm    [3.1-3.7]  RIGHT VENTRICLE      ST Junction: nm*     [2.6-3.2]     RV Base: nm*    [<4.2]       Asc.Aorta: nm*     [2.6-3.4]     RV Mid: nm*    [<3.5] LEFT VENTRICLE                   RV Length: nm*    [<8.6]         LVIDd: 6.1 cm    [4.2-5.9]  INFERIOR VENA CAVA         LVIDs: 4.9 cm            Max. IVC: nm*    [<=2.1]           FS: 19.4 %    [>25]      Min. IVC: nm*          SWT: 0.93 cm   [0.6-1.0]  ------------------  PWT: 0.88 cm   [0.6-1.0]  nm* - not measured LEFT ATRIUM        LA Diam: 4.2 cm    [3.0-4.0]      LA A4C Area: 23.6 cm2   [< 20]       LA Volume: 99.9 ml   [18-58] _________________________________________________________________________________________ ECHOCARDIOGRAPHIC DESCRIPTIONS AORTIC ROOT          Size: Normal       Dissection: INDETERM FOR DISSECTION AORTIC VALVE        Leaflets: Tricuspid          Morphology: Normal        Mobility: Fully mobile LEFT VENTRICLE           Size: MILDLY ENLARGED        Anterior: HYPOCONTRACTILE      Contraction: SEVERE GLOBAL DECREASE     Lateral: HYPOCONTRACTILE       Closest EF:<15% (Estimated)        Septal: HYPOCONTRACTILE       LV Masses: No Masses            Apical: HYPOCONTRACTILE          LVH: None             Inferior: HYPOCONTRACTILE                           Posterior: HYPOCONTRACTILE      Dias.FxClass: N/A MITRAL VALVE        Leaflets: Normal            Mobility: Fully mobile       Morphology: Normal LEFT ATRIUM          Size: MILDLY ENLARGED       LA Masses: No masses       IA Septum: Normal IAS MAIN PA          Size: Normal PULMONIC VALVE       Morphology: Normal            Mobility: Fully mobile RIGHT VENTRICLE       RV Masses: No Masses             Size: Normal       Free Wall: Normal           Contraction: Normal TRICUSPID VALVE        Leaflets: Normal            Mobility: Fully mobile       Morphology: Normal RIGHT ATRIUM          Size: Normal            RA Other: None        RA Mass: No masses PERICARDIUM         Fluid: No effusion _________________________________________________________________________________________  DOPPLER ECHO and OTHER SPECIAL PROCEDURES         Aortic: MILD AR          No AS         Mitral: MILD MR          No MS             MV Inflow E Vel = nm*   MV Annulus E'Vel = nm*             E/E'Ratio = nm*       Tricuspid: MILD TR          No TS  Pulmonary: MILD PR          No PS _________________________________________________________________________________________ INTERPRETATION SEVERE LV SYSTOLIC DYSFUNCTION (LVEF  <20%) NO SIGNIFICANT VALVULAR DYSFUNCTION (MILD TO MODERATE TR) NORMAL RV SIZE AND FUNCTION MILD PULMONARY HYPERTENSION NO PRIOR FOR COMPARISON _________________________________________________________________________________________ Electronically signed by Leonia Reeves, MD on 01/18/2019 02: 37 PM      Performed By: Morrie Sheldon, [place]   Ordering Physician: [facility], JEFFREY _________________________________________________________________________________________      Patient Profile     78 y.o. male hx of HTN, Afib with RVR on Eliquis and Tikosyn, HLD, CHF (LVEF <15%), pulmonary HTN, DM2, GERD, CKDIII, hyperthyroidism with recent L nodule, prostate CA, OA who is being seen today for the evaluation of VFib and cardiac arrest.  Assessment & Plan    VF with Cardiac Arrest and ROSC - Cardiac arrest, VF with ROSC. Estimated down time 5-55m Consider possible torsades prior to arrest given long QT as below with patient on Tikosyn. Remains intubated and sedated on ventilator with rewarming after hypothermia protocol. - No prior cardiac history or workup before [facility] hospitalization 12/2018 for new onset Afib with RVR as in HPI. Echo at that time showed EF less than 15% but no further ischemic workup / cath performed during this hospitalization.  - Unable to exclude cardiac ischemia until further ischemic workup. Given patient's current status, no current plans for catheterization and until stable. -  Troponin peaked 0.34, down-trending. -  Future EP consultation needed to evaluate for ICD placement given VF / arrest.  -  Holding Toprol d/t bradycardia / hypotension.  -  Started on heparin given Afib below. Continue with daily CBC. -  Started on amiodarone per MD and d/t recurrent atrial fibrillation with RVR. While amiodarone is not ideal in the setting of his current liver function, medication options are limited by current hypotension, renal function, long QT.  Continue to  monitor liver function. -  No Tikosyn recommended given long QT/QTc. -  Avoid digoxin given renal function. -  Daily BMET. Cr improving since first admitted but still elevated from baseline. Monitor electrolytes. Cr elevated from 6/2 Cr 1.4  1.9. K 3.7. Mg 2.1. -  Echo as above shows EF <20%. -  TSH <0.010 as below and started methimazole. A1C 6.0. -  Will contact wife for update.  New Paroxysmal Afib with RVR (12/2018) - [facility] failed D[facility] with repeat DCCV holding SR as above. Consider Afib triggered by hyperthyroid with low TSH and L thyroid nodule as in HPI. TSH <0.010 on recheck. - Initial EKG showed sinus tachycardia, right bundle branch block, left anterior fascicular block.  Recurrent atrial tachycardia and atrial fibrillation with RVR. Telemetry also has been significant for bradycardic rates. Repeat EKG showed SB, 53bpm, ST/T depression in precordial and inferior leads.  - Started on amiodarone per MD today 6/4 d/t recurrent Afib with RVR in AM - rates into the high 130s. Recommend close monitoring of liver function as above.  Monitor on telemetry. - Avoid Tikosyn given long QT as above.  Avoid digoxin given renal function as above. - Holding BB d/t hypotension. - Continue heparin as above. Daily CBC.  HFrEF (EF<15%) - No previous cath per EMR, future catheterization is recommended once he is stable and if renal function allows.  - Previous echos as copied and pasted above with EF less than 15%.   - Updated echo shows EF less than 20%, moderately elevated  right heart pressures, dilated left atrium. - BNP 3700 - Consider worsening heart failure in the setting of uncontrolled rate d/t Afib with RVR.  Cannot rule out cardiac ischemia as above. - On norepinephrine, per critical care.  - PTA Toprol and benazepril held d/t renal function, hypotension. Continue to hold.  HTN - Hypotensive, limiting rate control  options as above. - Continue holding PTA benazepril and Toprol as above with low BP.  HLD - Continue to hold PTA pravastatin 52m daily given liver function at admission.  AKI with CKDIII - Cr elevated from baseline and following arrest. - Caution with nephrotoxins.  - Daily BMET.  Hyperthyroidism - TSH <0.010. 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 - Started on methimazole - Consider as trigger for Afib with RVR - Per endocrinology, PCP.  For questions or updates, please contact CWalla WallaPlease consult [email or web address] for contact info under        Signed, JArvil Chaco PA-C  02/03/2019, 12:53 PM

## 2019-02-03 NOTE — Progress Notes (Signed)
[facility] for heparin drip management  Indication: atrial fibrillation  Allergies  Allergen Reactions  . Penicillins Other (See Comments)    Other Reaction: fainting, Other reaction  . Latex Rash    Patient Measurements: Height: '6\' 2"'  (188 cm) Weight: 171 lb 8.3 oz (77.8 kg) IBW/kg (Calculated) : 82.2  Vital Signs: Temp: 99.9 F (37.7 C) (06/04 1200) Temp Source: Bladder (06/04 1200) BP: 111/95 (06/04 1100) Pulse Rate: 68 (06/04 1200)  Labs: Recent Labs    02/02/2019 1743 02/15/2019 1830  02/08/2019 2112  02/02/19 0424 02/02/19 0657 02/02/19 1321 02/03/19 0505 02/03/19 0930  HGB 13.5  --   --   --   --  14.0  --   --  13.5  --   HCT 42.9  --   --   --   --  44.2  --   --  42.3  --   PLT 233  --   --   --   --  280  --   --  189  --   APTT  --  32  --  32  --   --   --   --   --  41*  LABPROT  --  18.2*  --  16.1*  --   --   --   --   --   --   INR  --  1.5*  --  1.3*  --   --   --   --   --   --   HEPARINUNFRC  --   --   --   --   --   --   --   --   --  0.52  CREATININE 1.78*  --   --   --   --  2.15*  --  2.32* 1.91*  --   TROPONINI 0.09*  --    < > 0.30*   < > 0.34* 0.30* 0.21*  --   --    < > = values in this interval not displayed.    Estimated Creatinine Clearance: 35.1 mL/min (A) (by C-G formula based on SCr of 1.91 mg/dL (H)).   Medical History: History reviewed. No pertinent past medical history.  Medications:  Scheduled:  . chlorhexidine gluconate (MEDLINE KIT)  15 mL Mouth Rinse BID  . Chlorhexidine Gluconate Cloth  6 each Topical Q0600  . doxycycline  100 mg Per Tube Q12H  . insulin aspart  0-15 Units Subcutaneous Q4H  . mouth rinse  15 mL Mouth Rinse 10 times per day  . methimazole  20 mg Per Tube BID  . [START ON 02/04/2019] pantoprazole sodium  40 mg Per Tube Daily   Infusions:  . sodium chloride 33.3 mL/hr at 02/03/19 0827  . amiodarone 60 mg/hr (02/03/19 1345)  . cefTRIAXone (ROCEPHIN)  IV 2 g (02/03/19  1424)  . feeding supplement (VITAL HIGH PROTEIN) 1,000 mL (02/03/19 1530)  . fentaNYL infusion INTRAVENOUS 150 mcg/hr (02/03/19 1200)  . heparin 1,200 Units/hr (02/03/19 1330)  . norepinephrine (LEVOPHED) Adult infusion Stopped (02/03/19 1059)  . propofol (DIPRIVAN) infusion Stopped (02/03/19 1011)    Assessment: Pharmacy consulted for heparin drip management for 78 yo male admitted s/p cardiac arrest from NSTEMI. Patient underwent targeted temperature management and has now been rewarmed. Patient previously on heparin 5000 unites TID while undergoing cooling phase. Patient now in atrial fibrillation. Patient previously diagnoses with atrial fibrillation in May and started on apixaban 51m BID.  Goal of Therapy:  Heparin level 0.3-0.7 units/ml aPTT 66-102 seconds Monitor platelets by anticoagulation protocol: Yes   Plan:  Will initiate heparin drip at 1200 units/hr after heparin bolus of 4,000 units/hr. Heparin level elevated secondary to apixaban administration. Will monitor using aPTTs.. Will obtain next heparin level with am labs on 6/6.   Pharmacy will continue to monitor and adjust per consult.   Simpson,Michael L 02/03/2019,3:54 PM

## 2019-02-03 NOTE — CV Procedure (Signed)
Cardioversion procedure note For atrial fibrillation, paroxysmal Rate 140 bpm  Procedure Details:  Consent: Risks of procedure as well as the alternatives and risks of each were explained to the (patient/caregiver). Consent for procedure obtained.  Time Out: Verified patient identification, verified procedure, site/side was marked, verified correct patient position, special equipment/implants available, medications/allergies/relevent history reviewed, required imaging and test results available. Performed  Patient placed on cardiac monitor, pulse oximetry, supplemental oxygen as necessary.  Sedation given: 30 mg propofol IV/infusion given Pacer pads placed anterior and posterior chest.  Cardioverted 1 time(s).  Cardioverted at 150 J. Synchronized biphasic Converted to NSR   Evaluation: Findings: Post procedure EKG shows: NSR Complications: None Patient did tolerate procedure well.  Time Spent Directly with the Patient:  45 minutes   Dossie Arbour, M.D., Ph.D.

## 2019-02-03 NOTE — Progress Notes (Signed)
CRITICAL CARE NOTE  CC  follow up respiratory failure Cardiac arrest  SUBJECTIVE Patient remains critically ill Prognosis is guarded On vent pressors Rewarming phase Wean sedation assess NEURO status   BP (!) 85/57   Pulse 63   Temp 97.7 F (36.5 C) (Bladder)   Resp 17   Ht 6' 2" (1.88 m)   Wt 77.8 kg   SpO2 95%   BMI 22.02 kg/m    I/O last 3 completed shifts: In: 1811.3 [I.V.:1731.3; Other:80] Out: 1100 [Urine:1040; Emesis/NG output:60] No intake/output data recorded.  SpO2: 95 % FiO2 (%): 30 %  Vent Mode: PRVC FiO2 (%):  [30 %] 30 % Set Rate:  [16 bmp] 16 bmp Vt Set:  [500 mL] 500 mL PEEP:  [5 cmH20] 5 cmH20 Plateau Pressure:  [15 cmH20] 15 cmH20   SIGNIFICANT EVENTS 6/1 cardiac arrest resp failure 6/2 hypothermia protocol 6/3 resp failure hypothermia protocol  REVIEW OF SYSTEMS  PATIENT IS UNABLE TO PROVIDE COMPLETE REVIEW OF SYSTEMS DUE TO SEVERE CRITICAL ILLNESS   PHYSICAL EXAMINATION:  GENERAL:critically ill appearing, +resp distress HEAD: Normocephalic, atraumatic.  EYES: Pupils equal, round, reactive to light.  No scleral icterus.  MOUTH: Moist mucosal membrane. NECK: Supple. No thyromegaly. No nodules. No JVD.  PULMONARY: +rhonchi, +wheezing CARDIOVASCULAR: S1 and S2. Regular rate and rhythm. No murmurs, rubs, or gallops.  GASTROINTESTINAL: Soft, nontender, -distended. No masses. Positive bowel sounds. No hepatosplenomegaly.  MUSCULOSKELETAL: No swelling, clubbing, or edema.  NEUROLOGIC: obtunded, GCS<8 SKIN:intact,warm,dry  MEDICATIONS: I have reviewed all medications and confirmed regimen as documented   CULTURE RESULTS   Recent Results (from the past 240 hour(s))  SARS Coronavirus 2 (CEPHEID - Performed in [facility] hospital lab), Hosp Order     Status: None   Collection Time: 02/13/2019  5:58 PM  Result Value Ref Range Status   SARS Coronavirus 2 NEGATIVE NEGATIVE Final    Comment: (NOTE) If result is NEGATIVE SARS-CoV-2  target nucleic acids are NOT DETECTED. The SARS-CoV-2 RNA is generally detectable in upper and lower  respiratory specimens during the acute phase of infection. The lowest  concentration of SARS-CoV-2 viral copies this assay can detect is 250  copies / mL. A negative result does not preclude SARS-CoV-2 infection  and should not be used as the sole basis for treatment or other  patient management decisions.  A negative result may occur with  improper specimen collection / handling, submission of specimen other  than nasopharyngeal swab, presence of viral mutation(s) within the  areas targeted by this assay, and inadequate number of viral copies  (<250 copies / mL). A negative result must be combined with clinical  observations, patient history, and epidemiological information. If result is POSITIVE SARS-CoV-2 target nucleic acids are DETECTED. The SARS-CoV-2 RNA is generally detectable in upper and lower  respiratory specimens dur ing the acute phase of infection.  Positive  results are indicative of active infection with SARS-CoV-2.  Clinical  correlation with patient history and other diagnostic information is  necessary to determine patient infection status.  Positive results do  not rule out bacterial infection or co-infection with other viruses. If result is PRESUMPTIVE POSTIVE SARS-CoV-2 nucleic acids MAY BE PRESENT.   A presumptive positive result was obtained on the submitted specimen  and confirmed on repeat testing.  While 2019 novel coronavirus  (SARS-CoV-2) nucleic acids may be present in the submitted sample  additional confirmatory testing may be necessary for epidemiological  and / or clinical management purposes  to differentiate   between  SARS-CoV-2 and other Sarbecovirus currently known to infect humans.  If clinically indicated additional testing with an alternate test  methodology ([id number]) is advised. The SARS-CoV-2 RNA is generally  detectable in upper and lower  respiratory sp ecimens during the acute  phase of infection. The expected result is Negative. Fact Sheet for Patients:  https://www.fda.gov/media/136312/download Fact Sheet for Healthcare Providers: https://www.fda.gov/media/136313/download This test is not yet approved or cleared by the [place] FDA and has been authorized for detection and/or diagnosis of SARS-CoV-2 by FDA under an Emergency Use Authorization (EUA).  This EUA will remain in effect (meaning this test can be used) for the duration of the COVID-19 declaration under Section 564(b)(1) of the Act, 21 U.S.C. section 360bbb-3(b)(1), unless the authorization is terminated or revoked sooner. Performed at [facility], [address] [place], [place] [address]   CULTURE, BLOOD (ROUTINE X 2) w Reflex to ID Panel     Status: None (Preliminary result)   Collection Time: 02/11/2019  8:42 PM  Result Value Ref Range Status   Specimen Description BLOOD RIGHT HAND  Final   Special Requests   Final    BOTTLES DRAWN AEROBIC AND ANAEROBIC Blood Culture adequate volume   Culture   Final    NO GROWTH 2 DAYS Performed at [facility], [address] [place], [place] [address]    Report Status PENDING  Incomplete  CULTURE, BLOOD (ROUTINE X 2) w Reflex to ID Panel     Status: None (Preliminary result)   Collection Time: 01/31/2019  8:49 PM  Result Value Ref Range Status   Specimen Description BLOOD LEFT HAND  Final   Special Requests   Final    BOTTLES DRAWN AEROBIC AND ANAEROBIC Blood Culture adequate volume   Culture   Final    NO GROWTH 2 DAYS Performed at [facility], [address] [place], [place] [address]    Report Status PENDING  Incomplete  Culture, respiratory (non-expectorated)     Status: None (Preliminary result)   Collection Time: 02/28/2019 10:57 PM  Result Value Ref Range Status   Specimen Description   Final    TRACHEAL ASPIRATE Performed at [facility], [address] [place], [place] [address]    Special Requests   Final    Normal Performed at [facility], [address] [place], [place] [address]    Gram Stain   Final    RARE WBC PRESENT, PREDOMINANTLY PMN MODERATE GRAM POSITIVE COCCI RARE GRAM VARIABLE ROD Performed at [facility], [address] [place], [place] [address]    Culture PENDING  Incomplete   Report Status PENDING  Incomplete  MRSA PCR Screening     Status: None   Collection Time: 02/19/2019 10:57 PM  Result Value Ref Range Status   MRSA by PCR NEGATIVE NEGATIVE Final    Comment:        The GeneXpert MRSA Assay (FDA approved for NASAL specimens only), is one component of a comprehensive MRSA colonization surveillance program. It is not intended to diagnose MRSA infection nor to guide or monitor treatment for MRSA infections. Performed at [facility], [address] [place], [place] [address]           IMAGING    Dg Abd 1 View  Result Date: 02/02/2019 CLINICAL DATA:  Check gastric catheter placement EXAM: ABDOMEN - 1 VIEW COMPARISON:  02/14/2019 FINDINGS: Gastric catheter has now reached the stomach. Scattered large and small   bowel gas is noted. IMPRESSION: Gastric catheter within the stomach. Electronically Signed   By: Inez Catalina M.D.   On: 02/02/2019 09:37    CBC    Component Value Date/Time   WBC 19.0 (H) 02/03/2019 0505   RBC 4.81 02/03/2019 0505   HGB 13.5 02/03/2019 0505   HCT 42.3 02/03/2019 0505   PLT 189 02/03/2019 0505   MCV 87.9 02/03/2019 0505   MCH 28.1 02/03/2019 0505   MCHC 31.9 02/03/2019 0505   RDW 14.6 02/03/2019 0505   LYMPHSABS 0.9 02/03/2019 0505   MONOABS 1.2 (H) 02/03/2019 0505   EOSABS 0.1 02/03/2019 0505   BASOSABS 0.0 02/03/2019 0505   BMP Latest Ref Rng & Units 02/03/2019 02/02/2019 02/02/2019  Glucose 70 - 99 mg/dL 86 119(H) 142(H)  BUN 8 - 23 mg/dL 40(H) 46(H) 40(H)  Creatinine 0.61 - 1.24 mg/dL 1.91(H) 2.32(H) 2.15(H)  Sodium 135 - 145 mmol/L  144 140 143  Potassium 3.5 - 5.1 mmol/L 3.7 4.4 4.7  Chloride 98 - 111 mmol/L 120(H) 110 112(H)  CO2 22 - 32 mmol/L 16(L) 19(L) 22  Calcium 8.9 - 10.3 mg/dL 6.8(L) 8.2(L) 8.3(L)       Indwelling Urinary Catheter continued, requirement due to   Reason to continue Indwelling Urinary Catheter strict Intake/Output monitoring for hemodynamic instability   Central Line/ continued, requirement due to  Reason to continue [place] of central venous pressure or other hemodynamic parameters and poor IV access   Ventilator continued, requirement due to severe respiratory failure   Ventilator Sedation RASS 0 to -2      ASSESSMENT AND PLAN SYNOPSIS  78 yo WM with acute cardiac arrest from NSTEMI with severe end stage CHF with acute renal failure  Severe ACUTE Hypoxic and Hypercapnic Respiratory Failure -continue Full MV support -continue Bronchodilator Therapy -Wean Fio2 and PEEP as tolerated -will perform SAT/SBT when respiratory parameters are met  ACUTE SYSTOLIC CARDIAC FAILURE- EF 20% Cardiac arrest NSTEMI -oxygen as needed -Lasix as tolerated -follow up cardiac enzymes as indicated -follow up cardiology recs    ACUTE KIDNEY INJURY/Renal Failure -follow chem 7 -follow UO -continue Foley Catheter-assess need -Avoid nephrotoxic agents -Recheck creatinine    NEUROLOGY - intubated and sedated - minimal sedation to achieve a RASS goal: -1 Wake up assessment pending   SHOCK-CARDIOGENIC -use vasopressors to keep MAP>65 -follow ABG and LA -follow up cultures   CARDIAC ICU monitoring  ID -continue IV abx as prescibed -follow up cultures  GI GI PROPHYLAXIS as indicated  NUTRITIONAL STATUS DIET-->TF's as tolerated Constipation protocol as indicated  ENDO - will use ICU hypoglycemic\Hyperglycemia protocol if indicated   ELECTROLYTES -follow labs as needed -replace as needed -pharmacy consultation and following   DVT/GI PRX  ordered TRANSFUSIONS AS NEEDED MONITOR FSBS ASSESS the need for LABS as needed   Critical Care Time devoted to patient care services described in this note is 34 minutes.   Overall, patient is critically ill, prognosis is guarded.  Patient with Multiorgan failure and at high risk for cardiac arrest and death.   Patient is DNR   Corrin Parker, M.D.  [facility]  Medical Director [place] Director [facility]

## 2019-02-04 ENCOUNTER — Other Ambulatory Visit: Payer: PRIVATE HEALTH INSURANCE

## 2019-02-04 LAB — COMPREHENSIVE METABOLIC PANEL
ALT: 69 U/L — ABNORMAL HIGH (ref 0–44)
AST: 38 U/L (ref 15–41)
Albumin: 2.9 g/dL — ABNORMAL LOW (ref 3.5–5.0)
Alkaline Phosphatase: 224 U/L — ABNORMAL HIGH (ref 38–126)
Anion gap: 11 (ref 5–15)
BUN: 61 mg/dL — ABNORMAL HIGH (ref 8–23)
CO2: 19 mmol/L — ABNORMAL LOW (ref 22–32)
Calcium: 8.4 mg/dL — ABNORMAL LOW (ref 8.9–10.3)
Chloride: 111 mmol/L (ref 98–111)
Creatinine, Ser: 2.86 mg/dL — ABNORMAL HIGH (ref 0.61–1.24)
GFR calc Af Amer: 23 mL/min — ABNORMAL LOW (ref >60)
GFR calc non Af Amer: 20 mL/min — ABNORMAL LOW (ref >60)
Glucose, Bld: 139 mg/dL — ABNORMAL HIGH (ref 70–99)
Potassium: 5.2 mmol/L — ABNORMAL HIGH (ref 3.5–5.1)
Sodium: 141 mmol/L (ref 135–145)
Total Bilirubin: 2.5 mg/dL — ABNORMAL HIGH (ref 0.3–1.2)
Total Protein: 5.9 g/dL — ABNORMAL LOW (ref 6.5–8.1)

## 2019-02-04 LAB — PHOSPHORUS: Phosphorus: 5.8 mg/dL — ABNORMAL HIGH (ref 2.5–4.6)

## 2019-02-04 LAB — CBC WITH DIFFERENTIAL/PLATELET
Abs Immature Granulocytes: 0.28 10*3/uL — ABNORMAL HIGH (ref 0.00–0.07)
Basophils Absolute: 0.1 10*3/uL (ref 0.0–0.1)
Basophils Relative: 0 %
Eosinophils Absolute: 0.1 10*3/uL (ref 0.0–0.5)
Eosinophils Relative: 0 %
HCT: 43.2 % (ref 39.0–52.0)
Hemoglobin: 13.5 g/dL (ref 13.0–17.0)
Immature Granulocytes: 1 %
Lymphocytes Relative: 6 %
Lymphs Abs: 1.3 10*3/uL (ref 0.7–4.0)
MCH: 27.8 pg (ref 26.0–34.0)
MCHC: 31.3 g/dL (ref 30.0–36.0)
MCV: 88.9 fL (ref 80.0–100.0)
Monocytes Absolute: 0.9 10*3/uL (ref 0.1–1.0)
Monocytes Relative: 4 %
Neutro Abs: 21.6 10*3/uL — ABNORMAL HIGH (ref 1.7–7.7)
Neutrophils Relative %: 89 %
Platelets: 209 10*3/uL (ref 150–400)
RBC: 4.86 MIL/uL (ref 4.22–5.81)
RDW: 15 % (ref 11.5–15.5)
WBC: 24.2 10*3/uL — ABNORMAL HIGH (ref 4.0–10.5)
nRBC: 0 % (ref 0.0–0.2)

## 2019-02-04 LAB — HEPARIN LEVEL (UNFRACTIONATED): Heparin Unfractionated: 0.61 IU/mL (ref 0.30–0.70)

## 2019-02-04 LAB — GLUCOSE, CAPILLARY
Glucose-Capillary: 107 mg/dL — ABNORMAL HIGH (ref 70–99)
Glucose-Capillary: 109 mg/dL — ABNORMAL HIGH (ref 70–99)
Glucose-Capillary: 122 mg/dL — ABNORMAL HIGH (ref 70–99)
Glucose-Capillary: 123 mg/dL — ABNORMAL HIGH (ref 70–99)
Glucose-Capillary: 136 mg/dL — ABNORMAL HIGH (ref 70–99)

## 2019-02-04 LAB — APTT
aPTT: 69 seconds — ABNORMAL HIGH (ref 24–36)
aPTT: 48 seconds — ABNORMAL HIGH (ref 24–36)

## 2019-02-04 LAB — CULTURE, RESPIRATORY W GRAM STAIN
Culture: NORMAL
Special Requests: NORMAL

## 2019-02-04 LAB — THYROID PANEL WITH TSH
Free Thyroxine Index: 2.3 (ref 1.2–4.9)
T3 Uptake Ratio: 37 % (ref 24–39)
T4, Total: 6.2 ug/dL (ref 4.5–12.0)
TSH: <0.006 u[IU]/mL — ABNORMAL LOW (ref 0.450–4.500)

## 2019-02-04 LAB — MAGNESIUM: Magnesium: 2.1 mg/dL (ref 1.7–2.4)

## 2019-02-04 MED ORDER — MORPHINE 100MG IN NS 100ML (1MG/ML) PREMIX INFUSION
1.0000 mg/h | INTRAVENOUS | Status: DC
  Administered 2019-02-04: 10 mg/h via INTRAVENOUS
  Administered 2019-02-04: 2 mg/h via INTRAVENOUS
  Administered 2019-02-05 (×2): 10 mg/h via INTRAVENOUS
  Filled 2019-02-04 (×3): qty 100

## 2019-02-04 MED ORDER — DIPHENHYDRAMINE HCL 50 MG/ML IJ SOLN: 25.0000 mg | INTRAMUSCULAR | Status: DC | PRN

## 2019-02-04 MED ORDER — POLYVINYL ALCOHOL 1.4 % OP SOLN: 1.0000 [drp] | Freq: Four times a day (QID) | OPHTHALMIC | Status: DC | PRN

## 2019-02-04 MED ORDER — LORAZEPAM 2 MG/ML IJ SOLN
INTRAMUSCULAR | Status: AC
  Administered 2019-02-04: 2 mg via INTRAVENOUS
  Filled 2019-02-04: qty 1

## 2019-02-04 MED ORDER — GLYCOPYRROLATE 0.2 MG/ML IJ SOLN
0.2000 mg | INTRAMUSCULAR | Status: DC | PRN
  Filled 2019-02-04: qty 1

## 2019-02-04 MED ORDER — DEXTROSE 5 % IV SOLN: INTRAVENOUS | Status: DC

## 2019-02-04 MED ORDER — PANTOPRAZOLE SODIUM 40 MG PO PACK
40.0000 mg | PACK | Freq: Every day | ORAL | Status: DC
  Administered 2019-02-04: 40 mg

## 2019-02-04 MED ORDER — ACETAMINOPHEN 325 MG PO TABS: 650.0000 mg | ORAL_TABLET | Freq: Four times a day (QID) | ORAL | Status: DC | PRN

## 2019-02-04 MED ORDER — GLYCOPYRROLATE 0.2 MG/ML IJ SOLN: 0.2000 mg | INTRAMUSCULAR | Status: DC | PRN

## 2019-02-04 MED ORDER — GLYCOPYRROLATE 1 MG PO TABS
1.0000 mg | ORAL_TABLET | ORAL | Status: DC | PRN
  Filled 2019-02-04: qty 1

## 2019-02-04 MED ORDER — LORAZEPAM 2 MG/ML IJ SOLN
2.0000 mg | INTRAMUSCULAR | Status: DC | PRN
  Administered 2019-02-04: 2 mg via INTRAVENOUS

## 2019-02-04 MED ORDER — ACETAMINOPHEN 650 MG RE SUPP: 650.0000 mg | Freq: Four times a day (QID) | RECTAL | Status: DC | PRN

## 2019-02-04 NOTE — Progress Notes (Signed)
Secure chat with provider to order GI prophylaxis for this patient.

## 2019-02-04 NOTE — Consult Note (Signed)
Reason for Consult:AMS Referring Physician: Kasa  CC: AMS  HPI: Tracy Fitzpatrick is an 78 y.o. male who is intubated and unable to provide any history.  All history obtained from the chart.  Patient admitted 6/2 after witnessed arrest.  Patient was recently admitted at [facility] with several days of increasing shortness of breath sleep at night and difficulty lying supine.  He was found to be in atrial fibrillation with RVR thought to be due to hyperthyroidism with probable impending storm. Failed TEE/cardioversion on 5/21.  Retured home with continued SOB.  Patient's wife states he has been sleeping on the recliner every night.  ON the night prior to admission he was noted to be confused and unable to find his way to the bathroom.  Wife report that he woke up on 6/2 and was able to go to his PCP appointment without any problem.  Patient told his wife he was not feeling well and went to sit in the recliner where he became unresponsive.  Patient wife called EMS and attempted CPR while waiting.  On EMS arrival patient was found to be in V. fib arrest they gave him 1 shock, AP x2 with ROCS.  He was intubated at the scene and brought to the ED.  Patient hypotensive on presentation.  Patient placed on hypothermia protocol after admission to the ICU.  Warmed as of 6/4, early morning.   Has been on Fentanyl.  Off Propofol as of yesterday morning.  Had further complications overnight.    Past medical history: AF, CKD-stage III,HTN, HLD, GERD, arthritis, prediabetes, prostate cancer, CHF  Surgical history: Total hip replacement, cholecystectomy, back surgery  Family history: Mother with HTN, HLD, stroke and uterine cancer.  Died at age 73.  Father with CAD. Died at age 41.  Brother with kidney failure.  Died at age 44.    Social History:  reports that he has quit smoking. He has never used smokeless tobacco. He reports previous alcohol use. He reports that he does not use drugs.  Allergies  Allergen  Reactions  . Penicillins Other (See Comments)    Other Reaction: fainting, Other reaction  . Latex Rash    Medications:  I have reviewed the patient's current medications. Prior to Admission:  Medications Prior to Admission  Medication Sig Dispense Refill Last Dose  . apixaban (ELIQUIS) 5 MG TABS tablet Take 5 mg by mouth 2 (two) times a day.   02/14/2019 at 0900  . benazepril (LOTENSIN) 20 MG tablet Take 20 mg by mouth daily.   02/21/2019 at 0900  . dofetilide (TIKOSYN) 250 MCG capsule Take 250 mcg by mouth every 12 (twelve) hours.   01/31/2019 at 0900  . dorzolamide-timolol (COSOPT) 22.3-6.8 MG/ML ophthalmic solution Place 1 drop into both eyes 2 (two) times daily.   02/12/2019 at 0900  . finasteride (PROSCAR) 5 MG tablet Take 5 mg by mouth daily.   02/10/2019 at 0900  . latanoprost (XALATAN) 0.005 % ophthalmic solution Place 1 drop into both eyes at bedtime.   01/31/2019 at Unknown time  . methimazole (TAPAZOLE) 10 MG tablet Take 20 mg by mouth 2 (two) times a day.   02/16/2019 at 0900  . metoprolol succinate (TOPROL-XL) 100 MG 24 hr tablet Take 100 mg by mouth daily.   02/27/2019 at 0900  . pravastatin (PRAVACHOL) 10 MG tablet TAKE 1 TABLET BY MOUTH EVERY DAY AT NIGHT   01/31/2019 at Unknown time   Scheduled: . chlorhexidine gluconate (MEDLINE KIT)  15 mL  Mouth Rinse BID  . Chlorhexidine Gluconate Cloth  6 each Topical Q0600  . doxycycline  100 mg Per Tube Q12H  . feeding supplement (PRO-STAT SUGAR FREE 64)  30 mL Per Tube Daily  . insulin aspart  0-15 Units Subcutaneous Q4H  . mouth rinse  15 mL Mouth Rinse 10 times per day  . methimazole  20 mg Per Tube BID    ROS: Unable to provide due to intubation  Physical Examination: Blood pressure 132/71, pulse 81, temperature 98.8 F (37.1 C), temperature source Bladder, resp. rate 16, height '6\' 2"'  (1.88 m), weight 83 kg, SpO2 96 %.  HEENT-  Normocephalic, no lesions, without obvious abnormality.  Normal external eye and conjunctiva.  Normal TM's  bilaterally.  Normal auditory canals and external ears. Normal external nose, mucus membranes and septum.  Normal pharynx. Cardiovascular- S1, S2 normal, pulses palpable throughout   Lungs- chest clear, no wheezing, rales, normal symmetric air entry Abdomen- soft, non-tender; bowel sounds normal; no masses,  no organomegaly Extremities- LE edema Lymph-no adenopathy palpable Musculoskeletal-no joint tenderness, deformity or swelling Skin-warm and dry, no hyperpigmentation, vitiligo, or suspicious lesions  Neurological Examination   Mental Status: Patient does not respond to verbal stimuli.  With deep sternal rub arms flex to chest.  Does not follow commands.  No verbalizations noted.  Cranial Nerves: II: patient does not respond confrontation bilaterally, pupils right 3 mm, left 3 mm,and reactive bilaterally III,IV,VI: doll's response absent bilaterally. Eyes deviated upward. V,VII: corneal reflex present bilaterally  VIII: patient does not respond to verbal stimuli IX,X: gag reflex not tested, XI: trapezius strength unable to test bilaterally XII: tongue strength unable to test Motor/Sensory: Flexion to chest of upper extremities with sternal rub.  Some flexion noted in the lower extremities with stimulation.   Deep Tendon Reflexes:  2+ throughout with absent AJ's bilaterally Plantars: upgoing on the left and downgoing on the right Cerebellar: Unable to perform   Laboratory Studies:   Basic Metabolic Panel: Recent Labs  Lab 03/01/2019 1743 02/02/19 0424 02/02/19 1321 02/03/19 0505 02/03/19 0930 02/04/19 0416  NA 138 143 140 144  --  141  K 3.8 4.7 4.4 3.7  --  5.2*  CL 108 112* 110 120*  --  111  CO2 18* 22 19* 16*  --  19*  GLUCOSE 208* 142* 119* 86  --  139*  BUN 30* 40* 46* 40*  --  61*  CREATININE 1.78* 2.15* 2.32* 1.91*  --  2.86*  CALCIUM 8.3* 8.3* 8.2* 6.8*  --  8.4*  MG  --   --  2.1  --  2.1 2.1  PHOS  --   --   --   --   --  5.8*    Liver Function  Tests: Recent Labs  Lab 02/03/2019 1743 02/02/19 0424 02/03/19 0505 02/04/19 0416  AST 43* 69* 67* 38  ALT 34 60* 68* 69*  ALKPHOS 89 98 175* 224*  BILITOT 1.1 2.3* 3.0* 2.5*  PROT 5.7* 5.8* 4.3* 5.9*  ALBUMIN 3.0* 3.2* 2.3* 2.9*   No results for input(s): LIPASE, AMYLASE in the last 168 hours. No results for input(s): AMMONIA in the last 168 hours.  CBC: Recent Labs  Lab 03/01/2019 1743 02/02/19 0424 02/03/19 0505 02/04/19 0416  WBC 12.9* 16.5* 19.0* 24.2*  NEUTROABS 7.9*  --  16.7* 21.6*  HGB 13.5 14.0 13.5 13.5  HCT 42.9 44.2 42.3 43.2  MCV 88.6 87.7 87.9 88.9  PLT 233 280 189 209  Cardiac Enzymes: Recent Labs  Lab 02/18/2019 2112 02/02/19 0105 02/02/19 0424 02/02/19 0657 02/02/19 1321  TROPONINI 0.30* 0.39* 0.34* 0.30* 0.21*    BNP: Invalid input(s): POCBNP  CBG: Recent Labs  Lab 02/03/19 1532 02/03/19 2058 02/04/19 0037 02/04/19 0423 02/04/19 0754  GLUCAP 105* 122* 107* 123* 109*    Microbiology: Results for orders placed or performed during the hospital encounter of 02/20/2019  Urine Culture     Status: None   Collection Time: 02/18/2019  5:41 PM  Result Value Ref Range Status   Specimen Description   Final    URINE, RANDOM Performed at [facility], [address], [place], [place] [address]    Special Requests   Final    Normal Performed at [facility], [address], [place], [place] [address]    Culture   Final    NO GROWTH Performed at [facility], [place] [address], [place], [place] [address]    Report Status 02/03/2019 FINAL  Final  SARS Coronavirus 2 (CEPHEID - Performed in [place] hospital lab), Hosp Order     Status: None   Collection Time: 02/24/2019  5:58 PM  Result Value Ref Range Status   SARS Coronavirus 2 NEGATIVE NEGATIVE Final    Comment: (NOTE) If result is NEGATIVE SARS-CoV-2 target nucleic acids are NOT DETECTED. The SARS-CoV-2 RNA is generally detectable in upper and lower  respiratory  specimens during the acute phase of infection. The lowest  concentration of SARS-CoV-2 viral copies this assay can detect is 250  copies / mL. A negative result does not preclude SARS-CoV-2 infection  and should not be used as the sole basis for treatment or other  patient management decisions.  A negative result may occur with  improper specimen collection / handling, submission of specimen other  than nasopharyngeal swab, presence of viral mutation(s) within the  areas targeted by this assay, and inadequate number of viral copies  (<250 copies / mL). A negative result must be combined with clinical  observations, patient history, and epidemiological information. If result is POSITIVE SARS-CoV-2 target nucleic acids are DETECTED. The SARS-CoV-2 RNA is generally detectable in upper and lower  respiratory specimens dur ing the acute phase of infection.  Positive  results are indicative of active infection with SARS-CoV-2.  Clinical  correlation with patient history and other diagnostic information is  necessary to determine patient infection status.  Positive results do  not rule out bacterial infection or co-infection with other viruses. If result is PRESUMPTIVE POSTIVE SARS-CoV-2 nucleic acids MAY BE PRESENT.   A presumptive positive result was obtained on the submitted specimen  and confirmed on repeat testing.  While 2019 novel coronavirus  (SARS-CoV-2) nucleic acids may be present in the submitted sample  additional confirmatory testing may be necessary for epidemiological  and / or clinical management purposes  to differentiate between  SARS-CoV-2 and other Sarbecovirus currently known to infect humans.  If clinically indicated additional testing with an alternate test  methodology [phone]) is advised. The SARS-CoV-2 RNA is generally  detectable in upper and lower respiratory sp ecimens during the acute  phase of infection. The expected result is Negative. Fact Sheet for  Patients:  StrictlyIdeas.no Fact Sheet for Healthcare Providers: BankingDealers.co.za This test is not yet approved or cleared by the [place] FDA and has been authorized for detection and/or diagnosis of SARS-CoV-2 by FDA under an Emergency Use Authorization (EUA).  This EUA will remain in effect (meaning this test  can be used) for the duration of the COVID-19 declaration under Section 564(b)(1) of the Act, 21 U.S.C. section 360bbb-3(b)(1), unless the authorization is terminated or revoked sooner. Performed at [facility], [address] [place], [place] [address]   CULTURE, BLOOD (ROUTINE X 2) w Reflex to ID Panel     Status: None (Preliminary result)   Collection Time: 02/28/2019  8:42 PM  Result Value Ref Range Status   Specimen Description BLOOD RIGHT HAND  Final   Special Requests   Final    BOTTLES DRAWN AEROBIC AND ANAEROBIC Blood Culture adequate volume   Culture   Final    NO GROWTH 3 DAYS Performed at [facility], [address], [place], [place] [address]    Report Status PENDING  Incomplete  CULTURE, BLOOD (ROUTINE X 2) w Reflex to ID Panel     Status: None (Preliminary result)   Collection Time: 01/31/2019  8:49 PM  Result Value Ref Range Status   Specimen Description BLOOD LEFT HAND  Final   Special Requests   Final    BOTTLES DRAWN AEROBIC AND ANAEROBIC Blood Culture adequate volume   Culture   Final    NO GROWTH 3 DAYS Performed at [facility], [address] [place], [place] [address]    Report Status PENDING  Incomplete  Culture, respiratory (non-expectorated)     Status: None (Preliminary result)   Collection Time: 02/24/2019 10:57 PM  Result Value Ref Range Status   Specimen Description   Final    TRACHEAL ASPIRATE Performed at [facility], [address], [place], [place] [address]    Special Requests   Final    Normal Performed at [facility], [address] [place], [place] [address]    Gram Stain   Final    RARE WBC PRESENT, PREDOMINANTLY PMN MODERATE GRAM POSITIVE COCCI RARE GRAM VARIABLE ROD    Culture   Final    CULTURE REINCUBATED FOR BETTER GROWTH Performed at [facility], [place] [address], [place], [place] [address]    Report Status PENDING  Incomplete  MRSA PCR Screening     Status: None   Collection Time: 02/02/2019 10:57 PM  Result Value Ref Range Status   MRSA by PCR NEGATIVE NEGATIVE Final    Comment:        The GeneXpert MRSA Assay (FDA approved for NASAL specimens only), is one component of a comprehensive MRSA colonization surveillance program. It is not intended to diagnose MRSA infection nor to guide or monitor treatment for MRSA infections. Performed at [facility], [address] [place], [place] [address]     Coagulation Studies: Recent Labs    02/16/2019 1830 02/13/2019 2112  LABPROT 18.2* 16.1*  INR 1.5* 1.3*    Urinalysis:  Recent Labs  Lab 02/04/2019 1741  COLORURINE YELLOW*  LABSPEC 1.019  PHURINE 5.0  GLUCOSEU NEGATIVE  HGBUR NEGATIVE  BILIRUBINUR NEGATIVE  KETONESUR NEGATIVE  PROTEINUR 100*  NITRITE NEGATIVE  LEUKOCYTESUR NEGATIVE    Lipid Panel:     Component Value Date/Time   TRIG 74 02/02/2019 1321    HgbA1C:  Lab Results  Component Value Date   HGBA1C 6.0 (H) 02/02/2019    Urine Drug Screen:      Component Value Date/Time   LABOPIA NONE DETECTED 02/11/2019 1741   COCAINSCRNUR NONE DETECTED 02/19/2019 1741   LABBENZ NONE DETECTED 02/07/2019 1741   AMPHETMU NONE DETECTED 02/28/2019 1741   THCU NONE DETECTED  02/11/2019 1741   LABBARB NONE DETECTED 02/11/2019 1741    Alcohol Level: No results for input(s): ETH in the last 168 hours.  Other results: EKG: atrial fibrillation, rate 135 bpm.  Imaging: 02/12/2019 CT HEAD WITHOUT CONTRAST  TECHNIQUE: Contiguous axial images were obtained from the base of the skull through the vertex without  intravenous contrast.  COMPARISON:  None.  FINDINGS: Brain: Mild chronic ischemic white matter disease is noted. No mass effect or midline shift is noted. Ventricular size is within normal limits. There is no evidence of mass lesion, hemorrhage or acute infarction.  Vascular: No hyperdense vessel or unexpected calcification.  Skull: Normal. Negative for fracture or focal lesion.  Sinuses/Orbits: No acute finding.  Other: None.  IMPRESSION: Mild chronic ischemic white matter disease. No acute intracranial abnormality seen.  Assessment/Plan: 78 year old male with multiple medical problems admitted after arrest.  Intubated and placed on hypothermia protocol.  Initial head CT reviewed and shows no acute changes.  Lab work suggests multi-organ involvement with evidence of worsening renal and hepatic function.  Patient has remained unresponsive but does not fulfill clinical criteria for brain death.  Is also still early in post hypothermia evaluation and has multiple metabolic abnormalities/end organ damage that may be contributing to mental status as well.   Agree with continued support.  Recommendations: 1. EEG to be repeated 2. Would consider repeating head CT on 6/7 if mental status not improved or earlier if neurological status declines.    Will continue to follow with you.    Alexis Goodell, MD Neurology [phone] 02/04/2019, 10:26 AM

## 2019-02-04 NOTE — Progress Notes (Signed)
[facility] [place]   PATIENT NAME: Tracy Fitzpatrick    MR#:  [id number]  DATE OF BIRTH:  23-Mar-1941  SUBJECTIVE:  CHIEF COMPLAINT:   Chief Complaint  Patient presents with  . Loss of Consciousness   Patient sedated on the vent Patient went into atrial fibrillation with rapid ventricular response recently and had to be started on amiodarone drip by cardiology. Neurologist was at bedside evaluating patient this morning. Notified by intensivist that patient has been made DNR/DNI and family would like to proceed with comfort care measures.  REVIEW OF SYSTEMS:  ROS Unobtainable  DRUG ALLERGIES:   Allergies  Allergen Reactions  . Penicillins Other (See Comments)    Other Reaction: fainting, Other reaction  . Latex Rash   VITALS:  Blood pressure (!) 172/80, pulse 93, temperature 98.8 F (37.1 C), temperature source Bladder, resp. rate (!) 23, height 6\' 2"  (1.88 m), weight 83 kg, SpO2 94 %. PHYSICAL EXAMINATION:   Physical Exam  Constitutional: He appears well-developed.  Sedated on the vent  HENT:  Head: Normocephalic and atraumatic.  Eyes: Pupils are equal, round, and reactive to light. Right eye exhibits no discharge.  Neck: Normal range of motion. No tracheal deviation present.  Cardiovascular: Normal rate, regular rhythm and normal heart sounds.  Respiratory: Effort normal.  Sedated on the vent  GI: Soft. Bowel sounds are normal. He exhibits no distension.  Musculoskeletal:        General: No deformity or edema.  Neurological:  Sedated on the vent  Skin: No rash noted. No erythema.  Psychiatric:  Sedated on the vent   LABORATORY PANEL:  Male CBC Recent Labs  Lab 02/04/19 0416  WBC 24.2*  HGB 13.5  HCT 43.2  PLT 209   ------------------------------------------------------------------------------------------------------------------ Chemistries  Recent Labs  Lab 02/04/19 0416  NA 141  K 5.2*  CL 111  CO2 19*  GLUCOSE  139*  BUN 61*  CREATININE 2.86*  CALCIUM 8.4*  MG 2.1  AST 38  ALT 69*  ALKPHOS 224*  BILITOT 2.5*   RADIOLOGY:  No results found. ASSESSMENT AND PLAN:   78 y.o. male with a known history of atrial fibrillation on Eliquis, CKD stage III, hypertension, hyperlipidemia, GERD, arthritis, prediabetes, prostate cancer and CHF presenting to the ED post witnessed cardiac arrest.  1.  Cardiac arrest  - patient presented with witnessed V. fib arrest post ACLS with ROSC  - Admitted to ICU - Mechanical ventilation.  Patient completed hypothermia protocol - CT head no acute findings - Management per ICU team.  Family has decided to make patient DNR and to placed on comfort care measures going forward.  2. Elevated troponins - Status post cardiac arrest and ACLS Patient now being placed on comfort care measures.  3. Systolic Congestive Heart Failure   Last Echo 01/18/19 EF 15% - Repeat Echo post cardiac arrest with ejection fraction of 20 to 25%. Patient now being placed on comfort care measures  3. 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 Atrial fibrillation - Patient was on Eliquis prior to admission.  Currently on hold due to patient being on the vent.  Cardiology managing.  Started on amiodarone drip for atrial fibrillation with rapid regular response 2 days ago.  Was also started on heparin drip. All medications noted to have been discontinued with patient being placed on comfort care measures  5. Hypertension -now hypotensive in the  setting of cardiac arrest - Holding all home BP meds  6. CKD (chronic kidney disease) stage 3, GFR 30-59 ml/min  Worsening of renal function . - Hold nephrotoxins  DVT proph: Heparin discontinued with patient being placed on comfort care measures   All the records are reviewed and case discussed with Care Management/Social Worker  CODE STATUS: DNR ICU physician discussed with family  and patient has been made DNR  TOTAL TIME TAKING CARE OF THIS PATIENT: 38 minutes.   More than 50% of the time was spent in counseling/coordination of care: YES  POSSIBLE D/C IN 2 DAYS, DEPENDING ON CLINICAL CONDITION.   Tracy Fitzpatrick M.D on 02/04/2019 at 1:52 PM  Between 7am to 6pm - Pager - [phone]  After 6pm go to [email or web address] - Social research officer, government  [facility]  Office  [phone]  CC: Primary care physician; Tracy Humphrey, MD  Note: This dictation was prepared with Dragon dictation along with smaller phrase technology. Any transcriptional errors that result from this process are unintentional.

## 2019-02-04 NOTE — Progress Notes (Signed)
Progress Note  Patient Name: Tracy Fitzpatrick Date of Encounter: 02/04/2019  Primary Cardiologist: Joya Salm [facility] - Dr. Rockey Situ  Subjective   Remains intubated / on ventilator since cardiac arrest.  Was in atrial fibrillation yesterday late morning, started on amiodarone infusion, given digoxin with minimal improvement in right Rebolus and amiodarone and then underwent cardioversion early evening yesterday Normal sinus rhythm restored, maintain normal sinus rhythm overnight rate in the 80-90 range Blood pressures improving, systolic pressure 335K this morning  Inpatient Medications    Scheduled Meds: . chlorhexidine gluconate (MEDLINE KIT)  15 mL Mouth Rinse BID  . Chlorhexidine Gluconate Cloth  6 each Topical Q0600  . doxycycline  100 mg Per Tube Q12H  . feeding supplement (PRO-STAT SUGAR FREE 64)  30 mL Per Tube Daily  . insulin aspart  0-15 Units Subcutaneous Q4H  . mouth rinse  15 mL Mouth Rinse 10 times per day  . methimazole  20 mg Per Tube BID  . pantoprazole sodium  40 mg Per Tube Daily   Continuous Infusions: . sodium chloride 33.3 mL/hr at 02/03/19 0827  . amiodarone 60 mg/hr (02/04/19 0922)  . cefTRIAXone (ROCEPHIN)  IV 2 g (02/04/19 0904)  . feeding supplement (VITAL 1.5 CAL) 1,000 mL (02/04/19 1110)  . fentaNYL infusion INTRAVENOUS Stopped (02/04/19 0751)  . heparin 1,200 Units/hr (02/04/19 0804)  . norepinephrine (LEVOPHED) Adult infusion Stopped (02/03/19 1059)  . propofol (DIPRIVAN) infusion Stopped (02/03/19 1011)   PRN Meds: sodium chloride, fentaNYL, ipratropium-albuterol, midazolam, midazolam   Vital Signs    Vitals:   02/04/19 0400 02/04/19 0414 02/04/19 0737 02/04/19 0800  BP: 132/71     Pulse: 73   81  Resp: 14   16  Temp: 98.8 F (37.1 C)   98.8 F (37.1 C)  TempSrc: Bladder   Bladder  SpO2: 95%  95% 96%  Weight:  83 kg    Height:        Intake/Output Summary (Last 24 hours) at 02/04/2019 1130 Last data filed at 02/04/2019 1048 Gross per 24  hour  Intake 2366.78 ml  Output 545 ml  Net 1821.78 ml   Filed Weights   02/17/2019 2006 02/04/19 0414  Weight: 77.8 kg 83 kg    Telemetry    Normal sinus rhythm- Personally Reviewed  ECG    Normal sinus rhythm- Personally Reviewed  Physical Exam   GEN: Sedated and on vent.  Neck: Unable to estimate JVD Cardiac:  Regular rate and rhythm,  , no murmurs, rubs, or gallops.  Respiratory: Intubated and on vent with coarse breath sounds bilaterally. GI: Soft, nontender, non-distended  MS: No bilateral lower extremity edema; No deformity. Neuro:  Sedated Psych: Sedated  Labs    Chemistry Recent Labs  Lab 02/02/19 0424 02/02/19 1321 02/03/19 0505 02/04/19 0416  NA 143 140 144 141  K 4.7 4.4 3.7 5.2*  CL 112* 110 120* 111  CO2 22 19* 16* 19*  GLUCOSE 142* 119* 86 139*  BUN 40* 46* 40* 61*  CREATININE 2.15* 2.32* 1.91* 2.86*  CALCIUM 8.3* 8.2* 6.8* 8.4*  PROT 5.8*  --  4.3* 5.9*  ALBUMIN 3.2*  --  2.3* 2.9*  AST 69*  --  67* 38  ALT 60*  --  68* 69*  ALKPHOS 98  --  175* 224*  BILITOT 2.3*  --  3.0* 2.5*  GFRNONAA 28* 26* 33* 20*  GFRAA 33* 30* 38* 23*  ANIONGAP _0 Hematology  Recent Labs  Lab 02/02/19 0424 02/03/19 0505 02/04/19 0416  WBC 16.5* 19.0* 24.2*  RBC 5.04 4.81 4.86  HGB 14.0 13.5 13.5  HCT 44.2 42.3 43.2  MCV 87.7 87.9 88.9  MCH 27.8 28.1 27.8  MCHC 31.7 31.9 31.3  RDW 14.1 14.6 15.0  PLT 280 189 209    Cardiac Enzymes Recent Labs  Lab 02/02/19 0105 02/02/19 0424 02/02/19 0657 02/02/19 1321  TROPONINI 0.39* 0.34* 0.30* 0.21*   No results for input(s): TROPIPOC in the last 168 hours.   BNP Recent Labs  Lab 02/02/19 0424  BNP 3,727.0*     DDimer No results for input(s): DDIMER in the last 168 hours.   Radiology    No results found.  Cardiac Studies   TTE 02/02/2019 IMPRESSIONS  1. The left ventricle has severely reduced systolic function, with an ejection fraction of <20%. The cavity size was mild to  moderately dilated. Unable to exclude regional wall motion abnormality.  2. The right ventricle has normal systolic function. The cavity was normal. There is no increase in right ventricular wall thickness. Right ventricular systolic pressure is moderately elevated with an estimated pressure of 53.0 mmHg.  3. Left atrial size was moderately dilated.  4. There is dilatation of the aortic root, 3.6 cm    Patient Profile     78 y.o. male hx of HTN, Afib with RVR on Eliquis and Tikosyn, HLD, CHF (LVEF <15%), pulmonary HTN, DM2, GERD, CKDIII, hyperthyroidism with recent L nodule, prostate CA, OA who is being seen today for the evaluation of VFib and cardiac arrest.  Assessment & Plan    Cardiac arrest V. fib arrest, normal sinus rhythm restored in the emergency room Cooling protocol completed  atrial fibrillation with RVR less than 12 hours yesterday now maintaining normal sinus rhythm following cardioversion June 4 evening Has been off Tikosyn started as outpatient given dramatic EKG changes, prolonged QT (exacerbated by cooling protocol) Currently on amiodarone infusion Echocardiogram again confirms ejection fraction less than 15% in normal sinus rhythm We will need ischemic work-up before this hospital discharge Minimally elevated troponin On heparin infusion  Cardiomyopathy, dilated Unable to exclude ischemic cardiomyopathy heparin for now Ischemic work-up this admission May need milrinone infusion for worsening renal dysfunction,  low output failure  Atrial fibrillation with RVR, paroxysmal Secondary to dilated cardiomyopathy, hyperthyroidism Would continue amiodarone infusion Repeat cardioversion for recurrent atrial fibrillation with RVR We will hold digoxin for now given worsening renal function  Acute renal failure Suspect ATN in the setting of cardiac arrest Unable to exclude low output failure, may need milrinone infusion  Hyerthyroidism Likely driving his atrial  fibrillation in addition to cardiomyopathy, structural changes On methimazole   Total encounter time more than 35 minutes  Greater than 50% was spent in counseling and coordination of care with the patient    For questions or updates, please contact [facility] Please consult [email or web address] for contact info under        Signed, Ida Rogue, MD  02/04/2019, 11:30 AM

## 2019-02-04 NOTE — Progress Notes (Signed)
[facility] for heparin drip management  Indication: atrial fibrillation  Allergies  Allergen Reactions  . Penicillins Other (See Comments)    Other Reaction: fainting, Other reaction  . Latex Rash    Patient Measurements: Height: _0  (188 cm) Weight: 171 lb 8.3 oz (77.8 kg) IBW/kg (Calculated) : 82.2  Vital Signs: Temp: 98.6 F (37 C) (06/05 0000) Temp Source: Bladder (06/04 2000) BP: 134/65 (06/05 0315) Pulse Rate: 71 (06/05 0315)  Labs: Recent Labs    02/04/2019 1743  02/19/2019 1830  02/19/2019 2112  02/02/19 0424 02/02/19 0657 02/02/19 1321 02/03/19 0505 02/03/19 0930 02/03/19 2143  HGB 13.5  --   --   --   --   --  14.0  --   --  13.5  --   --   HCT 42.9  --   --   --   --   --  44.2  --   --  42.3  --   --   PLT 233  --   --   --   --   --  280  --   --  189  --   --   APTT  --    < > 32  --  32  --   --   --   --   --  41* 74*  LABPROT  --   --  18.2*  --  16.1*  --   --   --   --   --   --   --   INR  --   --  1.5*  --  1.3*  --   --   --   --   --   --   --   HEPARINUNFRC  --   --   --   --   --   --   --   --   --   --  0.52  --   CREATININE 1.78*  --   --   --   --   --  2.15*  --  2.32* 1.91*  --   --   TROPONINI 0.09*  --   --    < > 0.30*   < > 0.34* 0.30* 0.21*  --   --   --    < > = values in this interval not displayed.    Estimated Creatinine Clearance: 35.1 mL/min (A) (by C-G formula based on SCr of 1.91 mg/dL (H)).   Medical History: History reviewed. No pertinent past medical history.  Medications:  Scheduled:  . chlorhexidine gluconate (MEDLINE KIT)  15 mL Mouth Rinse BID  . Chlorhexidine Gluconate Cloth  6 each Topical Q0600  . doxycycline  100 mg Per Tube Q12H  . feeding supplement (PRO-STAT SUGAR FREE 64)  30 mL Per Tube Daily  . insulin aspart  0-15 Units Subcutaneous Q4H  . mouth rinse  15 mL Mouth Rinse 10 times per day  . methimazole  20 mg Per Tube BID  . pantoprazole sodium  40 mg Per Tube  Daily   Infusions:  . sodium chloride 33.3 mL/hr at 02/03/19 0827  . amiodarone 60 mg/hr (02/04/19 0300)  . cefTRIAXone (ROCEPHIN)  IV Stopped (02/03/19 1530)  . feeding supplement (VITAL 1.5 CAL) 1,000 mL (02/03/19 1726)  . fentaNYL infusion INTRAVENOUS 200 mcg/hr (02/04/19 0300)  . heparin 1,200 Units/hr (02/04/19 0300)  . norepinephrine (LEVOPHED) Adult infusion Stopped (02/03/19 1059)  . propofol (  DIPRIVAN) infusion Stopped (02/03/19 1011)    Assessment: Pharmacy consulted for heparin drip management for 78 yo male admitted s/p cardiac arrest from NSTEMI. Patient underwent targeted temperature management and has now been rewarmed. Patient previously on heparin 5000 unites TID while undergoing cooling phase. Patient now in atrial fibrillation. Patient previously diagnoses with atrial fibrillation in May and started on apixaban 69m BID.   Goal of Therapy:  Heparin level 0.3-0.7 units/ml aPTT 66-102 seconds Monitor platelets by anticoagulation protocol: Yes   Plan:  06/05 @ 2200 aPTT 74 seconds therapeutic. Will continue current rate and will recheck next aPTT @ 0400 w/ a HL to determine correlation. CBC stable will continue to monitor.  DTobie Lords PharmD, BCPS Clinical Pharmacist 02/04/2019,3:52 AM

## 2019-02-04 NOTE — Progress Notes (Signed)
MD Gollan at bedside, amio to stay at 60mg  and cardiovert if necessary/appropriate per MD.

## 2019-02-04 NOTE — Progress Notes (Signed)
Family At bedside, clinical status relayed to family  Updated and notified of patients medical condition-  Progressive multiorgan failure with very low chance of meaningful recovery.  Patient is in dying  Process.  Family understands the situation.  They have consented and agreed to DNR/DNI and would like to proceed with Comfort care measures.   Family are satisfied with Plan of action and management. All questions answered  Kriti Katayama David Gino Garrabrant, M.D.  [facility]  Medical Director [facility] Medical Director [facility] Cardio-Pulmonary Department     

## 2019-02-04 NOTE — Progress Notes (Signed)
Pt appears to be comfortable at this time.  Morphine drip is infusing.  [name] is at bedside.  [name] tearfully requested a visit from the chaplain.  Chaplain was paged and was able to visit with family.  Family seems more at ease now.  Tracy Fitzpatrick was called due to GCS of 3.  Spoke with [name] and she gave a referral number of [phone].  She requested to have a call back at time of death.

## 2019-02-04 NOTE — Progress Notes (Signed)
CRITICAL CARE NOTE  CC  follow up respiratory failure Follow-up cardiac arrest  SUBJECTIVE Patient remains critically ill Prognosis is guarded Increased work of breathing Status post cardioversion Progressive renal failure    BP 132/71   Pulse 73   Temp 98.8 F (37.1 C) (Bladder)   Resp 14   Ht 6\' 2"  (1.88 m)   Wt 83 kg   SpO2 95%   BMI 23.49 kg/m    I/O last 3 completed shifts: In: 2666.4 [I.V.:1751.6; NG/GT:714.8; IV Piggyback:200] Out: 1075 [Urine:1075] No intake/output data recorded.  SpO2: 95 % FiO2 (%): 30 %   SIGNIFICANT EVENTS 6/1 cardiac arrest resp failure 6/2 hypothermia protocol 6/3 resp failure hypothermia protocol 6/4 remains on ventilator, status post electrical cardioversion by cardiology  REVIEW OF SYSTEMS  PATIENT IS UNABLE TO PROVIDE COMPLETE REVIEW OF SYSTEMS DUE TO SEVERE CRITICAL ILLNESS   PHYSICAL EXAMINATION:  GENERAL:critically ill appearing, +resp distress HEAD: Normocephalic, atraumatic.  EYES: Pupils equal, round, reactive to light.  No scleral icterus.  MOUTH: Moist mucosal membrane. NECK: Supple. No thyromegaly. No nodules. No JVD.  PULMONARY: +rhonchi, +wheezing CARDIOVASCULAR: S1 and S2. Regular rate and rhythm. No murmurs, rubs, or gallops.  GASTROINTESTINAL: Soft, nontender, -distended. No masses. Positive bowel sounds. No hepatosplenomegaly.  MUSCULOSKELETAL: No swelling, clubbing, or edema.  NEUROLOGIC: obtunded, GCS<8 SKIN:intact,warm,dry  MEDICATIONS: I have reviewed all medications and confirmed regimen as documented   CULTURE RESULTS   Recent Results (from the past 240 hour(s))  Urine Culture     Status: None   Collection Time: 02/12/2019  5:41 PM  Result Value Ref Range Status   Specimen Description   Final    URINE, RANDOM Performed at [facility], [address] [place], [place] [address]    Special Requests   Final    Normal Performed at [facility], [address]  [place], [place] [address]    Culture   Final    NO GROWTH Performed at [facility], [address] [place], [place] [address]    Report Status 02/03/2019 FINAL  Final  SARS Coronavirus 2 (CEPHEID - Performed in [facility] hospital lab), Hosp Order     Status: None   Collection Time: 02/25/2019  5:58 PM  Result Value Ref Range Status   SARS Coronavirus 2 NEGATIVE NEGATIVE Final    Comment: (NOTE) If result is NEGATIVE SARS-CoV-2 target nucleic acids are NOT DETECTED. The SARS-CoV-2 RNA is generally detectable in upper and lower  respiratory specimens during the acute phase of infection. The lowest  concentration of SARS-CoV-2 viral copies this assay can detect is 250  copies / mL. A negative result does not preclude SARS-CoV-2 infection  and should not be used as the sole basis for treatment or other  patient management decisions.  A negative result may occur with  improper specimen collection / handling, submission of specimen other  than nasopharyngeal swab, presence of viral mutation(s) within the  areas targeted by this assay, and inadequate number of viral copies  (<250 copies / mL). A negative result must be combined with clinical  observations, patient history, and epidemiological information. If result is POSITIVE SARS-CoV-2 target nucleic acids are DETECTED. The SARS-CoV-2 RNA is generally detectable in upper and lower  respiratory specimens dur ing the acute phase of infection.  Positive  results are indicative of active infection with SARS-CoV-2.  Clinical  correlation with patient history and other diagnostic information is  necessary to determine patient infection status.  Positive results do  not rule out bacterial infection or co-infection with other viruses. If result is PRESUMPTIVE POSTIVE SARS-CoV-2 nucleic acids MAY BE PRESENT.   A presumptive positive result was obtained on the submitted specimen  and confirmed on repeat testing.  While 2019 novel coronavirus   (SARS-CoV-2) nucleic acids may be present in the submitted sample  additional confirmatory testing may be necessary for epidemiological  and / or clinical management purposes  to differentiate between  SARS-CoV-2 and other Sarbecovirus currently known to infect humans.  If clinically indicated additional testing with an alternate test  methodology [phone]) is advised. The SARS-CoV-2 RNA is generally  detectable in upper and lower respiratory sp ecimens during the acute  phase of infection. The expected result is Negative. Fact Sheet for Patients:  BoilerBrush.com.cy Fact Sheet for Healthcare Providers: https://pope.com/ This test is not yet approved or cleared by the [place] FDA and has been authorized for detection and/or diagnosis of SARS-CoV-2 by FDA under an Emergency Use Authorization (EUA).  This EUA will remain in effect (meaning this test can be used) for the duration of the COVID-19 declaration under Section 564(b)(1) of the Act, 21 U.S.C. section 360bbb-3(b)(1), unless the authorization is terminated or revoked sooner. Performed at [facility], [address] [place], [place] [address]   CULTURE, BLOOD (ROUTINE X 2) w Reflex to ID Panel     Status: None (Preliminary result)   Collection Time: February 24, 2019  8:42 PM  Result Value Ref Range Status   Specimen Description BLOOD RIGHT HAND  Final   Special Requests   Final    BOTTLES DRAWN AEROBIC AND ANAEROBIC Blood Culture adequate volume   Culture   Final    NO GROWTH 3 DAYS Performed at [facility], [address] [place], [place] [address]    Report Status PENDING  Incomplete  CULTURE, BLOOD (ROUTINE X 2) w Reflex to ID Panel     Status: None (Preliminary result)   Collection Time: 2019-02-24  8:49 PM  Result Value Ref Range Status   Specimen Description BLOOD LEFT HAND  Final   Special Requests   Final    BOTTLES DRAWN AEROBIC AND ANAEROBIC  Blood Culture adequate volume   Culture   Final    NO GROWTH 3 DAYS Performed at [facility], [address] [place], [place] [address]    Report Status PENDING  Incomplete  Culture, respiratory (non-expectorated)     Status: None (Preliminary result)   Collection Time: Feb 24, 2019 10:57 PM  Result Value Ref Range Status   Specimen Description   Final    TRACHEAL ASPIRATE Performed at [facility], [address], [place], [place] [address]    Special Requests   Final    Normal Performed at [facility], [address] [place], [place] [address]    Gram Stain   Final    RARE WBC PRESENT, PREDOMINANTLY PMN MODERATE GRAM POSITIVE COCCI RARE GRAM VARIABLE ROD    Culture   Final    CULTURE REINCUBATED FOR BETTER GROWTH Performed at [facility], [address] [place], [place] [address]    Report Status PENDING  Incomplete  MRSA PCR Screening     Status: None   Collection Time: 02-24-2019 10:57 PM  Result Value Ref Range Status   MRSA by PCR NEGATIVE NEGATIVE Final    Comment:        The GeneXpert MRSA Assay (FDA approved for NASAL specimens only), is  one component of a comprehensive MRSA colonization surveillance program. It is not intended to diagnose MRSA infection nor to guide or monitor treatment for MRSA infections. Performed at [facility], [address] [place], [place] [address]          CBC    Component Value Date/Time   WBC 24.2 (H) 02/04/2019 0416   RBC 4.86 02/04/2019 0416   HGB 13.5 02/04/2019 0416   HCT 43.2 02/04/2019 0416   PLT 209 02/04/2019 0416   MCV 88.9 02/04/2019 0416   MCH 27.8 02/04/2019 0416   MCHC 31.3 02/04/2019 0416   RDW 15.0 02/04/2019 0416   LYMPHSABS 1.3 02/04/2019 0416   MONOABS 0.9 02/04/2019 0416   EOSABS 0.1 02/04/2019 0416   BASOSABS 0.1 02/04/2019 0416   BMP Latest Ref Rng & Units 02/04/2019 02/03/2019 02/02/2019  Glucose 70 - 99 mg/dL 604(V) 86 409(W)  BUN 8 - 23 mg/dL 11(B)  14(N) 82(N)  Creatinine 0.61 - 1.24 mg/dL 5.62(Z) 3.08(M) 5.78(I)  Sodium 135 - 145 mmol/L 141 144 140  Potassium 3.5 - 5.1 mmol/L 5.2(H) 3.7 4.4  Chloride 98 - 111 mmol/L 111 120(H) 110  CO2 22 - 32 mmol/L 19(L) 16(L) 19(L)  Calcium 8.9 - 10.3 mg/dL 6.9(G) 6.8(L) 8.2(L)         Indwelling Urinary Catheter continued, requirement due to   Reason to continue Indwelling Urinary Catheter strict Intake/Output monitoring for hemodynamic instability   Central Line/ continued, requirement due to  Reason to continue [employer] Monitoring of central venous pressure or other hemodynamic parameters and poor IV access   Ventilator continued, requirement due to severe respiratory failure   Ventilator Sedation RASS 0 to -2      ASSESSMENT AND PLAN SYNOPSIS  78 year old white male with severe acute cardiac arrest from severe end NSTEMI With acute severe hypoxic respiratory failure status post hypothermia protocol complicated by progressive renal failure and cardiogenic shock Patient with underlying end-stage CHF  Severe ACUTE Hypoxic and Hypercapnic Respiratory Failure -continue Full MV support -continue Bronchodilator Therapy -Wean Fio2 and PEEP as tolerated -Able to wean at this time due to severe end-stage CHF  ACUTE SYSTOLIC CARDIAC FAILURE- EF 15% -oxygen as needed -Lasix as tolerated -follow up cardiac enzymes as indicated -follow up cardiology recs S/p   electrical cardioversion    ACUTE KIDNEY INJURY/Renal Failure -follow chem 7 -follow UO -continue Foley Catheter-assess need -Avoid nephrotoxic agents -Recheck creatinine  -Patient may need dialysis   NEUROLOGY -We will repeat CT head Obtain EEG Consider neurology consultation  SHOCK-CARDIOGENIC -use vasopressors to keep MAP>65 -follow ABG and LA -follow up cultures   CARDIAC ICU monitoring  ID -continue IV abx as prescibed -follow up cultures  GI GI PROPHYLAXIS as indicated  NUTRITIONAL  STATUS DIET-->TF's as tolerated Constipation protocol as indicated  ENDO - will use ICU hypoglycemic\Hyperglycemia protocol if indicated   ELECTROLYTES -follow labs as needed -replace as needed -pharmacy consultation and following   DVT/GI PRX ordered TRANSFUSIONS AS NEEDED MONITOR FSBS ASSESS the need for LABS as needed   Critical Care Time devoted to patient care services described in this note is 45 minutes.   Overall, patient is critically ill, prognosis is guarded.  Patient with Multiorgan failure and at high risk for cardiac arrest and death.   We will obtain palliative care consultation Patient is DNR   Lucie Leather, M.D.  [facility]  Medical Director [facility] Medical Director [facility] Cardio-Pulmonary Department

## 2019-02-04 NOTE — Progress Notes (Signed)
[facility] for heparin drip management  Indication: atrial fibrillation  Allergies  Allergen Reactions  . Penicillins Other (See Comments)    Other Reaction: fainting, Other reaction  . Latex Rash    Patient Measurements: Height: _0  (188 cm) Weight: 182 lb 15.7 oz (83 kg) IBW/kg (Calculated) : 82.2  Vital Signs: Temp: 98.8 F (37.1 C) (06/05 0400) Temp Source: Bladder (06/05 0400) BP: 132/71 (06/05 0400) Pulse Rate: 73 (06/05 0400)  Labs: Recent Labs    02/24/2019 1830  01/31/2019 2112  02/02/19 0424 02/02/19 0657 02/02/19 1321 02/03/19 0505 02/03/19 0930 02/03/19 2143 02/04/19 0416  HGB  --   --   --   --  14.0  --   --  13.5  --   --  13.5  HCT  --   --   --   --  44.2  --   --  42.3  --   --  43.2  PLT  --   --   --   --  280  --   --  189  --   --  209  APTT 32  --  32  --   --   --   --   --  41* 74* 69*  LABPROT 18.2*  --  16.1*  --   --   --   --   --   --   --   --   INR 1.5*  --  1.3*  --   --   --   --   --   --   --   --   HEPARINUNFRC  --   --   --   --   --   --   --   --  0.52  --  0.61  CREATININE  --   --   --   --  2.15*  --  2.32* 1.91*  --   --  2.86*  TROPONINI  --    < > 0.30*   < > 0.34* 0.30* 0.21*  --   --   --   --    < > = values in this interval not displayed.    Estimated Creatinine Clearance: 24.7 mL/min (A) (by C-G formula based on SCr of 2.86 mg/dL (H)).   Medical History: History reviewed. No pertinent past medical history.  Medications:  Scheduled:  . chlorhexidine gluconate (MEDLINE KIT)  15 mL Mouth Rinse BID  . Chlorhexidine Gluconate Cloth  6 each Topical Q0600  . doxycycline  100 mg Per Tube Q12H  . feeding supplement (PRO-STAT SUGAR FREE 64)  30 mL Per Tube Daily  . insulin aspart  0-15 Units Subcutaneous Q4H  . mouth rinse  15 mL Mouth Rinse 10 times per day  . methimazole  20 mg Per Tube BID  . pantoprazole sodium  40 mg Per Tube Daily   Infusions:  . sodium chloride 33.3  mL/hr at 02/03/19 0827  . amiodarone 60 mg/hr (02/04/19 0400)  . cefTRIAXone (ROCEPHIN)  IV Stopped (02/03/19 1530)  . feeding supplement (VITAL 1.5 CAL) 1,000 mL (02/03/19 1726)  . fentaNYL infusion INTRAVENOUS 200 mcg/hr (02/04/19 0504)  . heparin 1,200 Units/hr (02/04/19 0300)  . norepinephrine (LEVOPHED) Adult infusion Stopped (02/03/19 1059)  . propofol (DIPRIVAN) infusion Stopped (02/03/19 1011)    Assessment: Pharmacy consulted for heparin drip management for 78 yo male admitted s/p cardiac arrest from NSTEMI. Patient underwent targeted temperature management and  has now been rewarmed. Patient previously on heparin 5000 unites TID while undergoing cooling phase. Patient now in atrial fibrillation. Patient previously diagnoses with atrial fibrillation in May and started on apixaban 43m BID.   Goal of Therapy:  Heparin level 0.3-0.7 units/ml aPTT 66-102 seconds Monitor platelets by anticoagulation protocol: Yes   Plan:  06/05 @ 0500 aPTT 69 seconds; HL 0.61 both correlating and therapeutic. Will continue current rate and will dose off of HL, will check w/ am labs. CBC stable will continue to monitor.  DTobie Lords PharmD, BCPS Clinical Pharmacist 02/04/2019,6:35 AM

## 2019-02-04 NOTE — Progress Notes (Signed)
   02/04/19 1200  Clinical Encounter Type  Visited With Patient and family together  Visit Type Follow-up;Spiritual support  Referral From Nurse  Consult/Referral To Chaplain  Spiritual Encounters  Spiritual Needs Emotional;Other (Comment)  Chaplain received page for comfort for the family of patient being extubated. Chaplain arrived and maintain emotional support and comfort. Patient's wife prayed and Chaplain offered to support family in any manner and patient's wife stated"she had her family".

## 2019-02-04 NOTE — Progress Notes (Signed)
Pt made collective decision to extubate patient to comfort care measures. Pt extubated to room air after being started on morphine drip. Order received for PRN ativan as morphine gtt alone not enough for pt to be comfortable. Family at bedside. Condolence cart ordered for family, family understands plan of care and that chaplain is available should they want chaplain support. Will continue to monitor.

## 2019-02-04 NOTE — Progress Notes (Signed)
Extubated without complications 

## 2019-02-04 NOTE — TOC Initial Note (Signed)
Transition of Care [facility]) - Initial/Assessment Note    Patient Details  Name: Tracy Fitzpatrick MRN: [id number] Date of Birth: [birth year]  [facility] CM/SW Contact:    Allayne Butcher, RN Phone Number: 02/04/2019, 11:28 AM  Clinical Narrative:                 Patient admitted on 6/2 after witnessed cardiac arrest at home while sitting on the porch.  Patient is currently in the ICU intubated, patient is off sedation and so far has been unresponsive.  Poor patient prognosis, ICU MD is wanting to set up family meeting today.    Expected Discharge Plan: (undetermined- poor prognosis) Barriers to Discharge: Continued Medical Work up   Patient Goals and CMS Choice        Expected Discharge Plan and Services Expected Discharge Plan: (undetermined- poor prognosis)                                              Prior Living Arrangements/Services     Patient language and need for interpreter reviewed:: No        Need for Family Participation in Patient Care: Yes (Comment)(poor prognosis) Care giver support system in place?: Yes (comment)   Criminal Activity/Legal Involvement Pertinent to Current Situation/Hospitalization: No - Comment as needed  Activities of Daily Living Home Assistive Devices/Equipment: None ADL Screening (condition at time of admission) Patient's cognitive ability adequate to safely complete daily activities?: No Is the patient deaf or have difficulty hearing?: No Does the patient have difficulty seeing, even when wearing glasses/contacts?: No Does the patient have difficulty concentrating, remembering, or making decisions?: No Patient able to express need for assistance with ADLs?: No Does the patient have difficulty dressing or bathing?: Yes Independently performs ADLs?: No Does the patient have difficulty walking or climbing stairs?: Yes Weakness of Legs: Both Weakness of Arms/Hands: Both  Permission Sought/Granted                   Emotional Assessment Appearance:: Appears stated age Attitude/Demeanor/Rapport: Intubated (Following Commands or Not Following Commands) Affect (typically observed): Unable to Assess(unresponsive)   Alcohol / Substance Use: Not Applicable Psych Involvement: No (comment)  Admission diagnosis:  Cardiac arrest (HCC) [I46.9] Acute respiratory failure (HCC) [J96.00] SOB (shortness of breath) [R06.02] Patient Active Problem List   Diagnosis Date Noted  . Acute respiratory failure (HCC)   . Cardiac arrest (HCC) 02/09/2019   PCP:  Rayetta Humphrey, MD Pharmacy:   [facility] [address] [place] - [address] [place] [address] Phone: [phone] Fax: [phone]     Social Determinants of Health (SDOH) Interventions    Readmission Risk Interventions No flowsheet data found.

## 2019-02-05 MED ORDER — MORPHINE BOLUS VIA INFUSION
4.0000 mg | INTRAVENOUS | Status: DC | PRN
  Administered 2019-02-05 (×6): 4 mg via INTRAVENOUS
  Filled 2019-02-05: qty 4

## 2019-02-06 LAB — CULTURE, BLOOD (ROUTINE X 2)
Culture: NO GROWTH
Culture: NO GROWTH
Special Requests: ADEQUATE
Special Requests: ADEQUATE

## 2019-02-07 ENCOUNTER — Telehealth: Payer: Self-pay | Admitting: Internal Medicine

## 2019-02-07 NOTE — Telephone Encounter (Signed)
Received fax death certificate from [employer].  Death certificate has been placed tin DK's folder.

## 2019-02-08 NOTE — Telephone Encounter (Signed)
Hard copy placed in DK's folder.

## 2019-02-08 NOTE — Telephone Encounter (Signed)
Received hard copy death certificate from [facility]. Placed in inbox up front.

## 2019-02-09 NOTE — Telephone Encounter (Signed)
No other MD available. He will be here tomorrow to sign.

## 2019-02-10 NOTE — Telephone Encounter (Signed)
Death certificate has been completed and faxed back to [facility] [employer] per his request.

## 2019-02-10 NOTE — Telephone Encounter (Signed)
Per Tracy Fitzpatrick- Death certificate will not be accepted by health department, due to address being printed on incorrect line.  Tracy Fitzpatrick will bring new certificate by our office.

## 2019-02-11 NOTE — Telephone Encounter (Signed)
Death certificate completed. Jimmy at [facility] aware. Faxed copy for him to review.

## 2019-02-11 NOTE — Telephone Encounter (Signed)
Death certificate has been placed in DK's folder.  

## 2019-03-02 NOTE — Progress Notes (Signed)
   [facility] at [place] NAME: Tracy Fitzpatrick    MR#:  [id number]  DATE OF BIRTH:  1941/05/14  SUBJECTIVE:  CHIEF COMPLAINT:   Chief Complaint  Patient presents with  . Loss of Consciousness   Placed on comfort care measures Morphine drip  REVIEW OF SYSTEMS:  ROS Unobtainable  DRUG ALLERGIES:   Allergies  Allergen Reactions  . Penicillins Other (See Comments)    Other Reaction: fainting, Other reaction  . Latex Rash   VITALS:  Blood pressure (!) 172/80, pulse 93, temperature 98.8 F (37.1 C), temperature source Bladder, resp. rate 15, height 6\' 2"  (1.88 m), weight 83 kg, SpO2 94 %. PHYSICAL EXAMINATION:   Laying in bed and unresponsive S1, S2 Coarse breath sounds LABORATORY PANEL:  Male CBC Recent Labs  Lab 02/04/19 0416  WBC 24.2*  HGB 13.5  HCT 43.2  PLT 209   ------------------------------------------------------------------------------------------------------------------ Chemistries  Recent Labs  Lab 02/04/19 0416  NA 141  K 5.2*  CL 111  CO2 19*  GLUCOSE 139*  BUN 61*  CREATININE 2.86*  CALCIUM 8.4*  MG 2.1  AST 38  ALT 69*  ALKPHOS 224*  BILITOT 2.5*   RADIOLOGY:  No results found. ASSESSMENT AND PLAN:   78 y.o. male with a known history of atrial fibrillation on Eliquis, CKD stage III, hypertension, hyperlipidemia, GERD, arthritis, prediabetes, prostate cancer and CHF presenting to the ED post witnessed cardiac arrest.  1.  Cardiac arrest  - patient presented with witnessed V. fib arrest post ACLS with ROSC  - Admitted to ICU - Mechanical ventilation.  Patient completed hypothermia protocol - CT head no acute findings -  Family has decided to make patient DNR and to placed on comfort care measures going forward.  2. Elevated troponins - Status post cardiac arrest and ACLS  3. Systolic Congestive Heart Failure   Last Echo 01/18/19 EF 15% - Repeat Echo post cardiac arrest with ejection  fraction of 20 to 25%.  3. 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 Atrial fibrillation -   5. Hypertension  6. CKD (chronic kidney disease) stage 3  DVT proph: Heparin discontinued with patient being placed on comfort care measures   All the records are reviewed and case discussed with Care Management/Social Worker  CODE STATUS: DNR   TOTAL TIME TAKING CARE OF THIS PATIENT: 25 minutes.   More than 50% of the time was spent in counseling/coordination of care: YES   Neita Carp M.D on 02/06/2019 at 7:28 AM  Between 7am to 6pm - Pager - [phone]  After 6pm go to [email or web address] - Proofreader  [facility]  Office  [phone]  CC: Primary care physician; Sharyne Peach, MD  Note: This dictation was prepared with Dragon dictation along with smaller phrase technology. Any transcriptional errors that result from this process are unintentional.

## 2019-03-02 NOTE — Progress Notes (Signed)
Spoke with spouse and family at bedside. Tracy Fitzpatrick, wife has requested [facility] in [place] [phone]

## 2019-03-02 NOTE — Progress Notes (Signed)
Family members remain at bedside.  Pt has morphine drip infusing.  Family members stated that they feel pt is resting comfortably and nothing else is needed at this time.

## 2019-03-02 NOTE — Progress Notes (Signed)
Patient expired at [place], pronounced by myself and [facility].

## 2019-03-02 NOTE — Death Summary Note (Signed)
DEATH SUMMARY   Patient Details  Name: Tracy Fitzpatrick MRN: [id number] DOB: Nov 13, 1940  Admission/Discharge Information   Admit Date:  Feb 21, 2019  Date of Death:   02-25-19   Time of Death:  01-03-1851  Length of Stay: 4  Referring Physician: Sharyne Peach, MD   Reason(s) for Hospitalization  Acute cardiac arrest  Diagnoses  Preliminary cause of death: Ischemic cardiomyopathy, severe end stage CHF, NSTEMI Secondary Diagnoses (including complications and co-morbidities):  Active Problems:   Cardiac arrest [facility])   Acute respiratory failure [facility])   Brief Hospital Course (including significant findings, care, treatment, and services provided and events leading to death)  Admitted for severe cardiac arrest Hypothermia protocol  Family At bedside, clinical status relayed to family  Updated and notified of patients medical condition-  Progressive multiorgan failure with very low chance of meaningful recovery.  Patient is in dying  Process.  Family understands the situation.  They have consented and agreed to DNR/DNI and would like to proceed with Comfort care measures.   Family are satisfied with Plan of action and management. All questions answered         Pertinent Labs and Studies  Significant Diagnostic Studies Dg Abd 1 View  Result Date: 02/02/2019 CLINICAL DATA:  Check gastric catheter placement EXAM: ABDOMEN - 1 VIEW COMPARISON:  02/21/19 FINDINGS: Gastric catheter has now reached the stomach. Scattered large and small bowel gas is noted. IMPRESSION: Gastric catheter within the stomach. Electronically Signed   By: Inez Catalina M.D.   On: 02/02/2019 09:37   Dg Abdomen 1 View  Result Date: 2019-02-21 CLINICAL DATA:  NG tube placement. EXAM: ABDOMEN - 1 VIEW COMPARISON:  None. FINDINGS: External pacer paddles are noted. The NG tube tip is in the midesophagus and needs to be advanced several cm. Scattered air in the small bowel and colon. No obvious free air.  IMPRESSION: The NG tube is in the mid esophagus and should be advanced several cm. Electronically Signed   By: Marijo Sanes M.D.   On: Feb 21, 2019 18:40   Ct Head Wo Contrast  Result Date: 02-21-2019 CLINICAL DATA:  Positive loss of consciousness. EXAM: CT HEAD WITHOUT CONTRAST TECHNIQUE: Contiguous axial images were obtained from the base of the skull through the vertex without intravenous contrast. COMPARISON:  None. FINDINGS: Brain: Mild chronic ischemic white matter disease is noted. No mass effect or midline shift is noted. Ventricular size is within normal limits. There is no evidence of mass lesion, hemorrhage or acute infarction. Vascular: No hyperdense vessel or unexpected calcification. Skull: Normal. Negative for fracture or focal lesion. Sinuses/Orbits: No acute finding. Other: None. IMPRESSION: Mild chronic ischemic white matter disease. No acute intracranial abnormality seen. Electronically Signed   By: Marijo Conception M.D.   On: 02/21/19 20:09   Dg Chest Port 1 View  Result Date: 02/02/2019 CLINICAL DATA:  Central line placement EXAM: PORTABLE CHEST 1 VIEW COMPARISON:  Feb 21, 2019 FINDINGS: The endotracheal tube terminates above the carina by 3 cm. There is a newly placed right-sided central venous catheter with tip projecting over the SVC. There is no evidence of a right-sided pneumothorax. The enteric tube remains malpositioned. The heart size is enlarged. There are prominent interstitial lung markings. Bilateral rib fractures are again noted. IMPRESSION: 1. Well-positioned central venous catheter without evidence of a pneumothorax. 2. Endotracheal tube as above. 3. OG tube is malpositioned.  Repositioning is recommended. 4. Stable additional findings as above. Electronically Signed   By: Jamie Kato.D.  On: 02/02/2019 00:07   Dg Chest Port 1 View  Result Date: 02/03/2019 CLINICAL DATA:  Intubation EXAM: PORTABLE CHEST 1 VIEW COMPARISON:  02/20/2019 FINDINGS: The endotracheal  tube terminates approximately 3.4 cm above the carina. The enteric tube is malpositioned in terminates at the level of the mid esophagus. The heart size is enlarged. Diffuse interstitial lung markings are again noted. There is no pneumothorax. No large pleural effusion. No acute osseous abnormality. There are probable acute displaced left-sided rib fractures. There are probable multiple displaced right-sided rib fractures. IMPRESSION: 1. Endotracheal tube terminates 3.4 cm above the carina. 2. OG tube terminates at the level of the mid esophagus. Repositioning is recommended. 3. Cardiomegaly with worsening interstitial lung markings concerning for progressive pulmonary edema/volume overload. 4. Acute bilateral rib fractures are noted, likely related to CPR. Electronically Signed   By: Katherine Mantlehristopher  Green M.D.   On: 02/10/2019 20:36   Dg Chest Portable 1 View  Result Date: 02/04/2019 CLINICAL DATA:  Intubation. EXAM: PORTABLE CHEST 1 VIEW COMPARISON:  None. FINDINGS: The heart is mildly enlarged. The mediastinal and hilar contours are within normal limits. The endotracheal tube is 5.3 cm above the carina. The NG tube is in the mid esophagus and needs to be advanced several cm. The lungs are clear of acute process. No infiltrates or effusions. No pneumothorax. The bony thorax is intact. IMPRESSION: Mild cardiac enlargement but no significant pulmonary findings. The ET tube is 5.3 cm above the carina. The NG tube is in the mid esophagus and should be advanced several cm. Electronically Signed   By: Rudie MeyerP.  Gallerani M.D.   On: 02/04/2019 18:42    Microbiology Recent Results (from the past 240 hour(s))  Urine Culture     Status: None   Collection Time: 02/26/2019  5:41 PM  Result Value Ref Range Status   Specimen Description   Final    URINE, RANDOM Performed at [facility], [address] [place], [place] [address]    Special Requests   Final    Normal Performed at [facility], [address] [place], [place] [address]    Culture   Final    NO GROWTH Performed at [facility], [address] [place], [place] [address]    Report Status 02/03/2019 FINAL  Final  SARS Coronavirus 2 (CEPHEID - Performed in [facility] hospital lab), Hosp Order     Status: None   Collection Time: 02/02/2019  5:58 PM  Result Value Ref Range Status   SARS Coronavirus 2 NEGATIVE NEGATIVE Final    Comment: (NOTE) If result is NEGATIVE SARS-CoV-2 target nucleic acids are NOT DETECTED. The SARS-CoV-2 RNA is generally detectable in upper and lower  respiratory specimens during the acute phase of infection. The lowest  concentration of SARS-CoV-2 viral copies this assay can detect is 250  copies / mL. A negative result does not preclude SARS-CoV-2 infection  and should not be used as the sole basis for treatment or other  patient management decisions.  A negative result may occur with  improper specimen collection / handling, submission of specimen other  than nasopharyngeal swab, presence of viral mutation(s) within the  areas targeted by this assay, and inadequate number of viral copies  (<250 copies / mL). A negative result must be combined with clinical  observations, patient history, and epidemiological information. If result is POSITIVE SARS-CoV-2 target nucleic acids are DETECTED. The SARS-CoV-2 RNA is generally detectable in upper and lower  respiratory specimens  dur ing the acute phase of infection.  Positive  results are indicative of active infection with SARS-CoV-2.  Clinical  correlation with patient history and other diagnostic information is  necessary to determine patient infection status.  Positive results do  not rule out bacterial infection or co-infection with other viruses. If result is PRESUMPTIVE POSTIVE SARS-CoV-2 nucleic acids MAY BE PRESENT.   A presumptive positive result was obtained on the submitted specimen  and confirmed on repeat testing.  While 2019  novel coronavirus  (SARS-CoV-2) nucleic acids may be present in the submitted sample  additional confirmatory testing may be necessary for epidemiological  and / or clinical management purposes  to differentiate between  SARS-CoV-2 and other Sarbecovirus currently known to infect humans.  If clinically indicated additional testing with an alternate test  methodology [phone]([id number]) is advised. The SARS-CoV-2 RNA is generally  detectable in upper and lower respiratory sp ecimens during the acute  phase of infection. The expected result is Negative. Fact Sheet for Patients:  BoilerBrush.com.cyhttps://www.fda.gov/media/136312/download Fact Sheet for Healthcare Providers: https://pope.com/https://www.fda.gov/media/136313/download This test is not yet approved or cleared by the [place] FDA and has been authorized for detection and/or diagnosis of SARS-CoV-2 by FDA under an Emergency Use Authorization (EUA).  This EUA will remain in effect (meaning this test can be used) for the duration of the COVID-19 declaration under Section 564(b)(1) of the Act, 21 U.S.C. section 360bbb-3(b)(1), unless the authorization is terminated or revoked sooner. Performed at [facility], [address] [place], [place] [address]   CULTURE, BLOOD (ROUTINE X 2) w Reflex to ID Panel     Status: None (Preliminary result)   Collection Time: 2019/02/11  8:42 PM  Result Value Ref Range Status   Specimen Description BLOOD RIGHT HAND  Final   Special Requests   Final    BOTTLES DRAWN AEROBIC AND ANAEROBIC Blood Culture adequate volume   Culture   Final    NO GROWTH 4 DAYS Performed at [facility], [address] [place], [place] [address]    Report Status PENDING  Incomplete  CULTURE, BLOOD (ROUTINE X 2) w Reflex to ID Panel     Status: None (Preliminary result)   Collection Time: 2019/02/11  8:49 PM  Result Value Ref Range Status   Specimen Description BLOOD LEFT HAND  Final   Special Requests   Final    BOTTLES DRAWN AEROBIC  AND ANAEROBIC Blood Culture adequate volume   Culture   Final    NO GROWTH 4 DAYS Performed at [facility], [address] [place], [place] [address]    Report Status PENDING  Incomplete  Culture, respiratory (non-expectorated)     Status: None   Collection Time: 2019/02/11 10:57 PM  Result Value Ref Range Status   Specimen Description   Final    TRACHEAL ASPIRATE Performed at [facility], [address] [place], [place] [address]    Special Requests   Final    Normal Performed at [facility], [address] [place], [place] [address]    Gram Stain   Final    RARE WBC PRESENT, PREDOMINANTLY PMN MODERATE GRAM POSITIVE COCCI RARE GRAM VARIABLE ROD    Culture   Final    ABUNDANT Consistent with normal respiratory flora. Performed at [facility], [address] [place], [place] [address]    Report Status 02/04/2019 FINAL  Final  MRSA PCR Screening     Status: None   Collection Time: 2019/02/11  10:57 PM  Result Value Ref Range Status   MRSA by PCR NEGATIVE NEGATIVE Final    Comment:        The GeneXpert MRSA Assay (FDA approved for NASAL specimens only), is one component of a comprehensive MRSA colonization surveillance program. It is not intended to diagnose MRSA infection nor to guide or monitor treatment for MRSA infections. Performed at [facility], [address] [place], [place] [address]     Lab Basic Metabolic Panel: Recent Labs  Lab 05/12/2019 1743 02/02/19 0424 02/02/19 1321 02/03/19 0505 02/03/19 0930 02/04/19 0416  NA 138 143 140 144  --  141  K 3.8 4.7 4.4 3.7  --  5.2*  CL 108 112* 110 120*  --  111  CO2 18* 22 19* 16*  --  19*  GLUCOSE 208* 142* 119* 86  --  139*  BUN 30* 40* 46* 40*  --  61*  CREATININE 1.78* 2.15* 2.32* 1.91*  --  2.86*  CALCIUM 8.3* 8.3* 8.2* 6.8*  --  8.4*  MG  --   --  2.1  --  2.1 2.1  PHOS  --   --   --   --   --  5.8*   Liver Function Tests: Recent Labs  Lab  05/12/2019 1743 02/02/19 0424 02/03/19 0505 02/04/19 0416  AST 43* 69* 67* 38  ALT 34 60* 68* 69*  ALKPHOS 89 98 175* 224*  BILITOT 1.1 2.3* 3.0* 2.5*  PROT 5.7* 5.8* 4.3* 5.9*  ALBUMIN 3.0* 3.2* 2.3* 2.9*   No results for input(s): LIPASE, AMYLASE in the last 168 hours. No results for input(s): AMMONIA in the last 168 hours. CBC: Recent Labs  Lab 05/12/2019 1743 02/02/19 0424 02/03/19 0505 02/04/19 0416  WBC 12.9* 16.5* 19.0* 24.2*  NEUTROABS 7.9*  --  16.7* 21.6*  HGB 13.5 14.0 13.5 13.5  HCT 42.9 44.2 42.3 43.2  MCV 88.6 87.7 87.9 88.9  PLT 233 280 189 209   Cardiac Enzymes: Recent Labs  Lab 05/12/2019 2112 02/02/19 0105 02/02/19 0424 02/02/19 0657 02/02/19 1321  TROPONINI 0.30* 0.39* 0.34* 0.30* 0.21*   Sepsis Labs: Recent Labs  Lab 05/12/2019 1743 05/12/2019 2041 05/12/2019 2257 02/02/19 0424 02/03/19 0505 02/04/19 0416  PROCALCITON  --  0.38  --  3.80 3.26  --   WBC 12.9*  --   --  16.5* 19.0* 24.2*  LATICACIDVEN  --  2.6* 1.3  --   --   --       Wallis BambergKurian Angle Dirusso 02/06/2019, 6:59 PM

## 2019-03-02 NOTE — Progress Notes (Signed)
   02-20-2019 1952  Clinical Encounter Type  Visited With Family;Health care provider  Visit Type Death  Referral From Nurse  Spiritual Encounters  Spiritual Needs Prayer;Emotional;Grief support   Chaplain received a page in the wake of the patient's death. Support requested. Upon arrival, the patient's wife and daughter were at the bedside and comforting one another. They were welcoming and expressed gratitude and appreciation for the visit. The patient's wife shared that they had both been with him for the last 24 hours and that he had fought the whole way, but was seen to be very peaceful after his extubation. They stated that seeing him in peace brought them peace. They shared some stories of the the patient's work in [employer], his strong work ethic, and reliability as a "good man". They confirmed that they have a loving and supportive family and were awaiting the arrival of the patient's daughter from [place]. This Probation officer offered support in compassionate presence, active and reflective listening and prayer for the strength, comfort and peace for the family in the wake of their father/husband's passing.

## 2019-03-02 NOTE — Progress Notes (Signed)
Family members remain at bedside.  Pt is resting comfortably with morphine drip infusing.  Family was asked if they would prefer a more private room and they declined.  Stated that they would rather stay here.  Spouse requested to visit the chapel for a bit.  I asked if she would like the chaplain to visit again and she declined.  I walked her to the [place] and let her voice her feelings.  She remained for a bit and is now back at bedside.

## 2019-03-02 NOTE — Progress Notes (Signed)
Pt appears to remain comfortable with current drip rate of morphine.  Family remains at bedside.  Recliner was put in room for family.  Offered another recliner, but family declined. More refreshments were added to the bereavement cart.  All questions were answered.  Will continue to make sure family's needs are met and pt. remains comfortable.

## 2019-03-02 NOTE — Progress Notes (Signed)
Comfort Care. Pt appears comfortable with morphine drip infusing. Family at bedside. Will continue to monitor.

## 2019-03-02 NOTE — Progress Notes (Signed)
Patient checked, and sheets changed. Morphine drip infusing. Family remains at bedside.

## 2019-03-02 NOTE — Progress Notes (Signed)
Family stepped out for break. Patient cleaned, and unnecessary lines/ pads and tubes removed. Housekeeping also mopped/cleaned and trash removed.

## 2019-03-02 DEATH — deceased

## 2020-08-15 IMAGING — CT CT HEAD WITHOUT CONTRAST
3 series · 14 of 47 positions shown, 16 images · non-contrast
Comparison: None.

CLINICAL DATA: Positive loss of consciousness.

EXAM:
CT HEAD WITHOUT CONTRAST
TECHNIQUE: Contiguous axial images were obtained from the base of the skull
through the vertex without intravenous contrast.

[Series 2: head wo · axial · 0.45mm/px · z∈[-131,-6]mm · 8 of 30 slices shown, 10 images]
[im 3/30  brain]
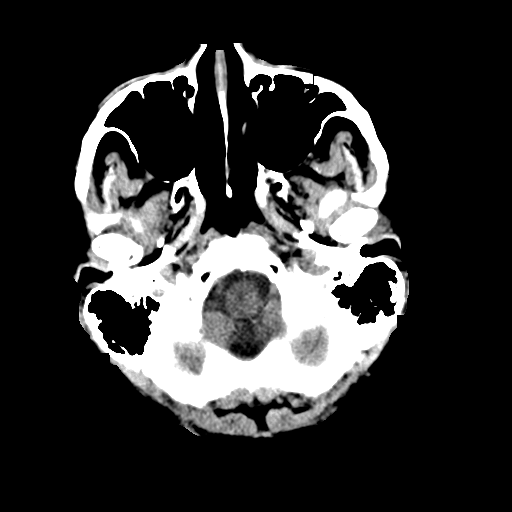
[im 3/30  bone]
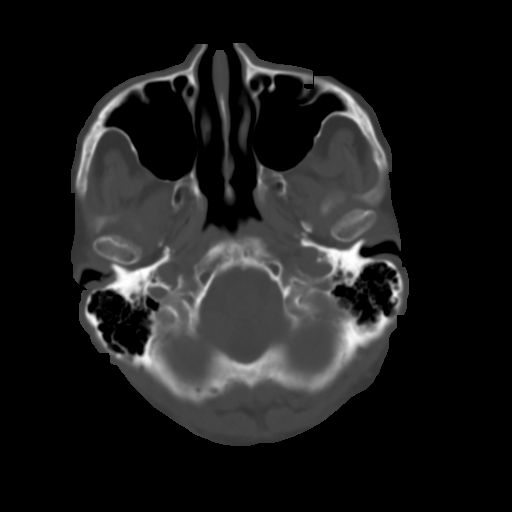
[im 7/30  brain]
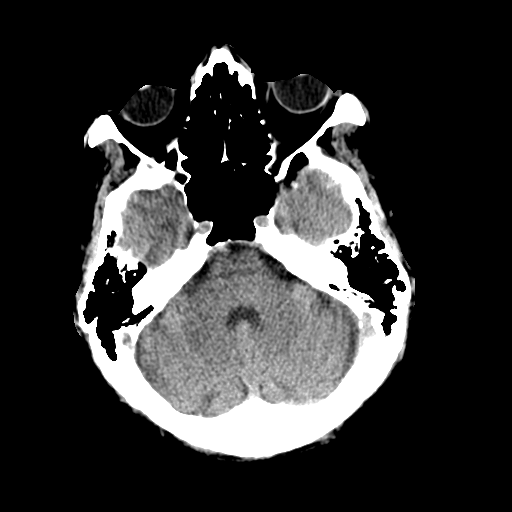
[im 10/30  brain]
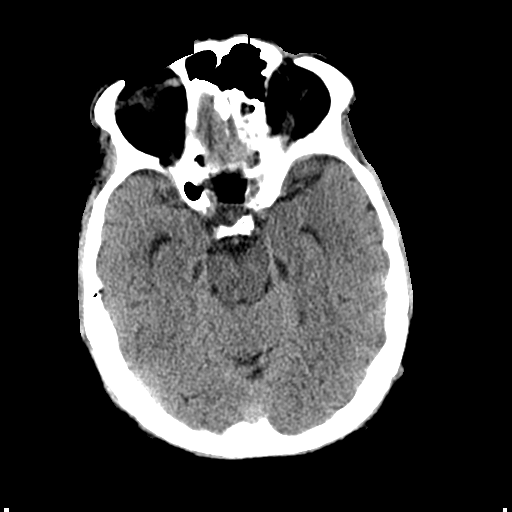
[im 14/30  brain]
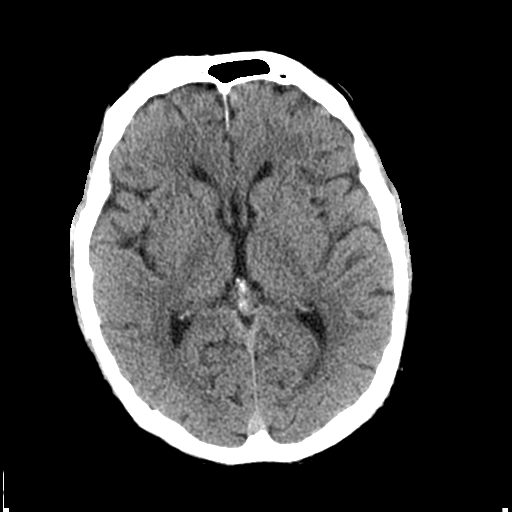
[im 17/30  brain]
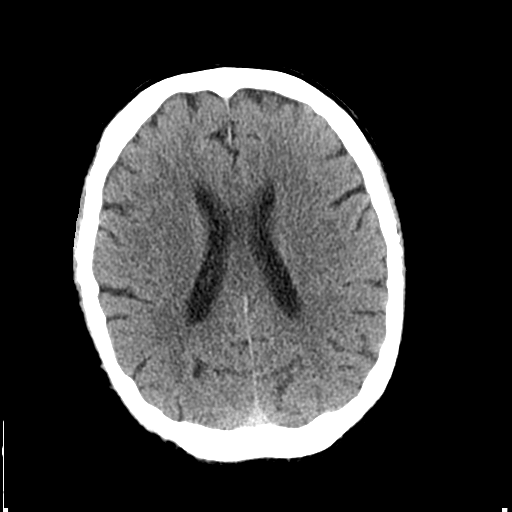
[im 17/30  bone]
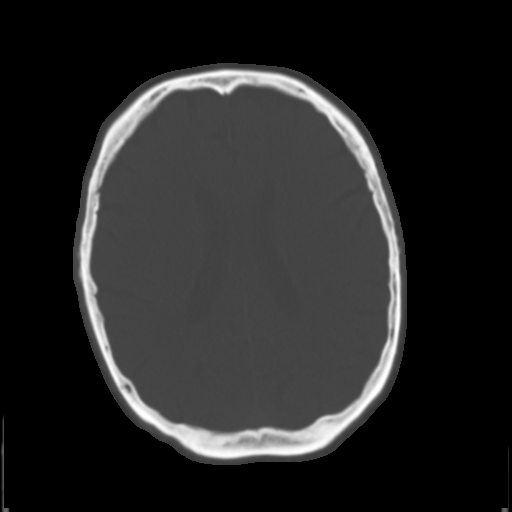
[im 21/30  brain]
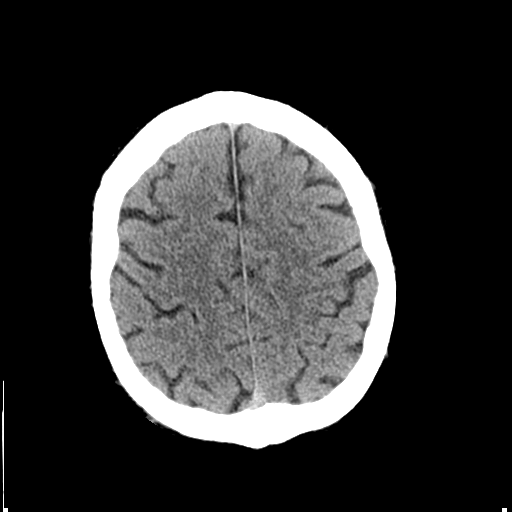
[im 24/30  brain]
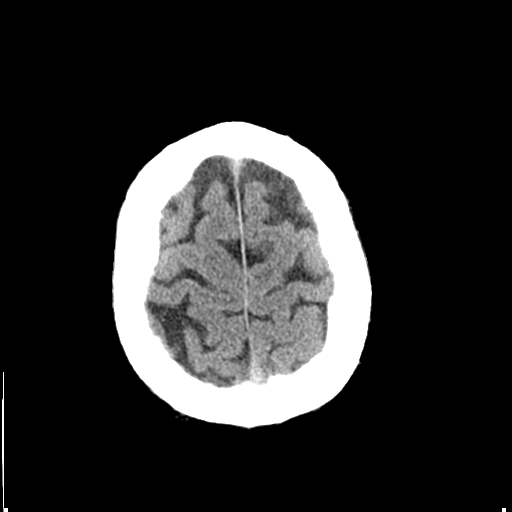
[im 28/30  brain]
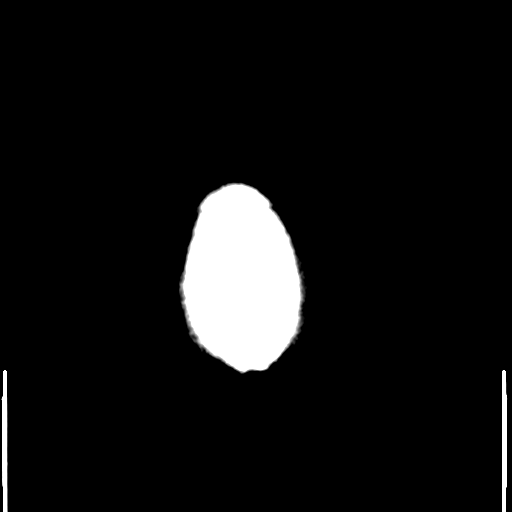

[Series 4: coronal soft tissue · coronal · 0.29mm/px · 3 of 67 slices shown]
[im 23/67  brain]
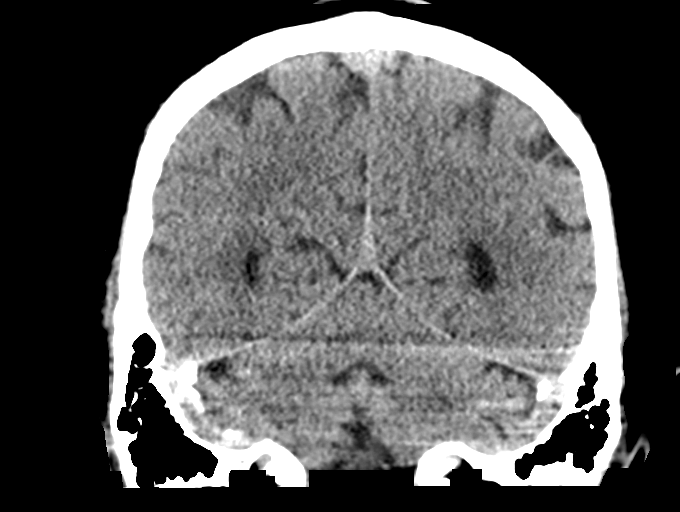
[im 30/67  brain]
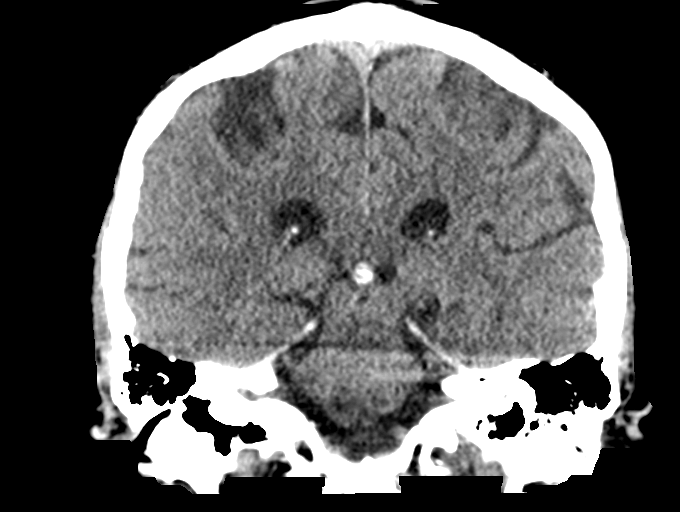
[im 37/67  brain]
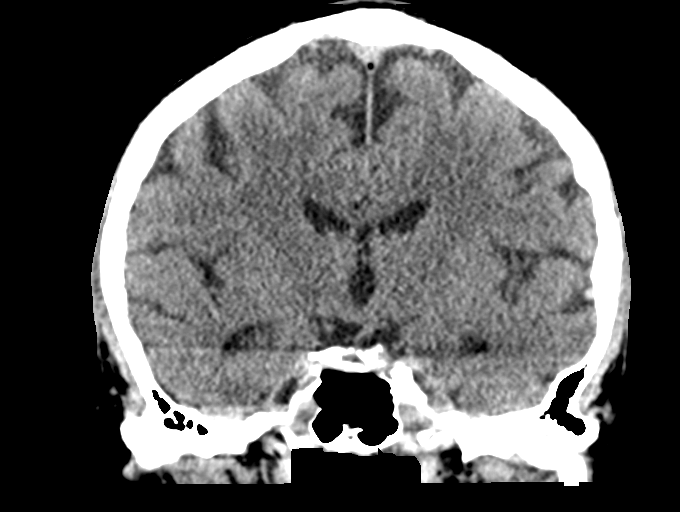

[Series 5: sagittal soft tissue · sagittal · 0.29mm/px · 3 of 56 slices shown]
[im 19/56  brain]
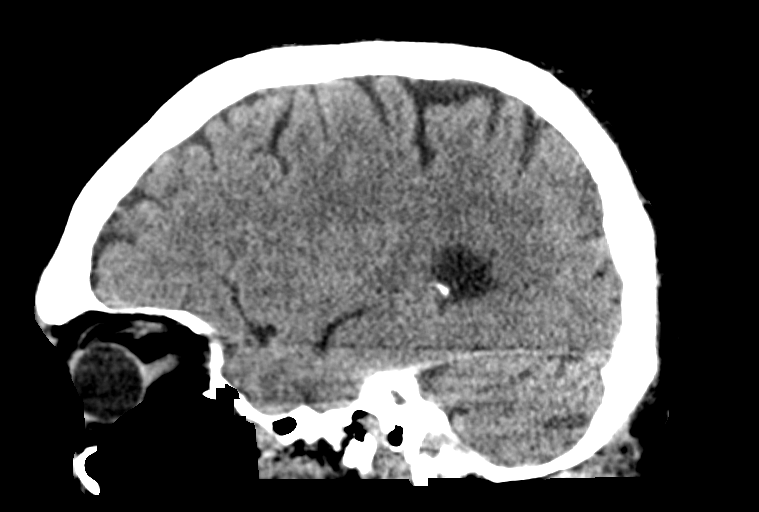
[im 28/56  brain]
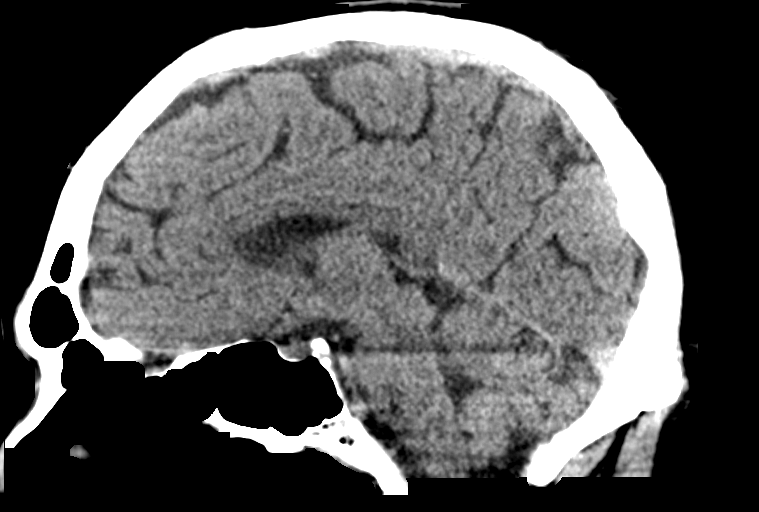
[im 37/56  brain]
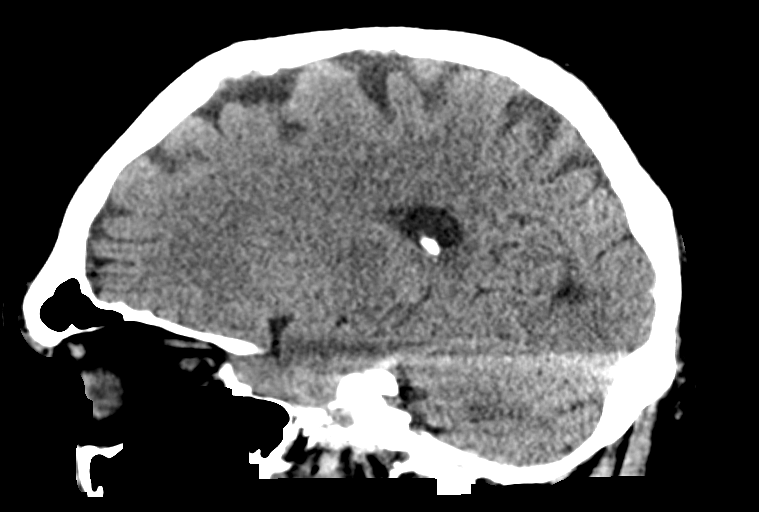

[14 of 47 positions shown; findings below may reference images not displayed]

FINDINGS: Brain: Mild chronic ischemic white matter disease is noted. No mass
effect or midline shift is noted. Ventricular size is within normal
limits. There is no evidence of mass lesion, hemorrhage or acute
infarction.

Vascular: No hyperdense vessel or unexpected calcification.

Skull: Normal. Negative for fracture or focal lesion.

Sinuses/Orbits: No acute finding.

Other: None.
IMPRESSION: Mild chronic ischemic white matter disease. No acute intracranial
abnormality seen.

## 2020-08-16 IMAGING — DX ABDOMEN - 1 VIEW
1 series · 1 of 1 positions shown · non-contrast
Comparison: 02/01/2019

CLINICAL DATA: Check gastric catheter placement

EXAM:
ABDOMEN - 1 VIEW

[abdomen supine]
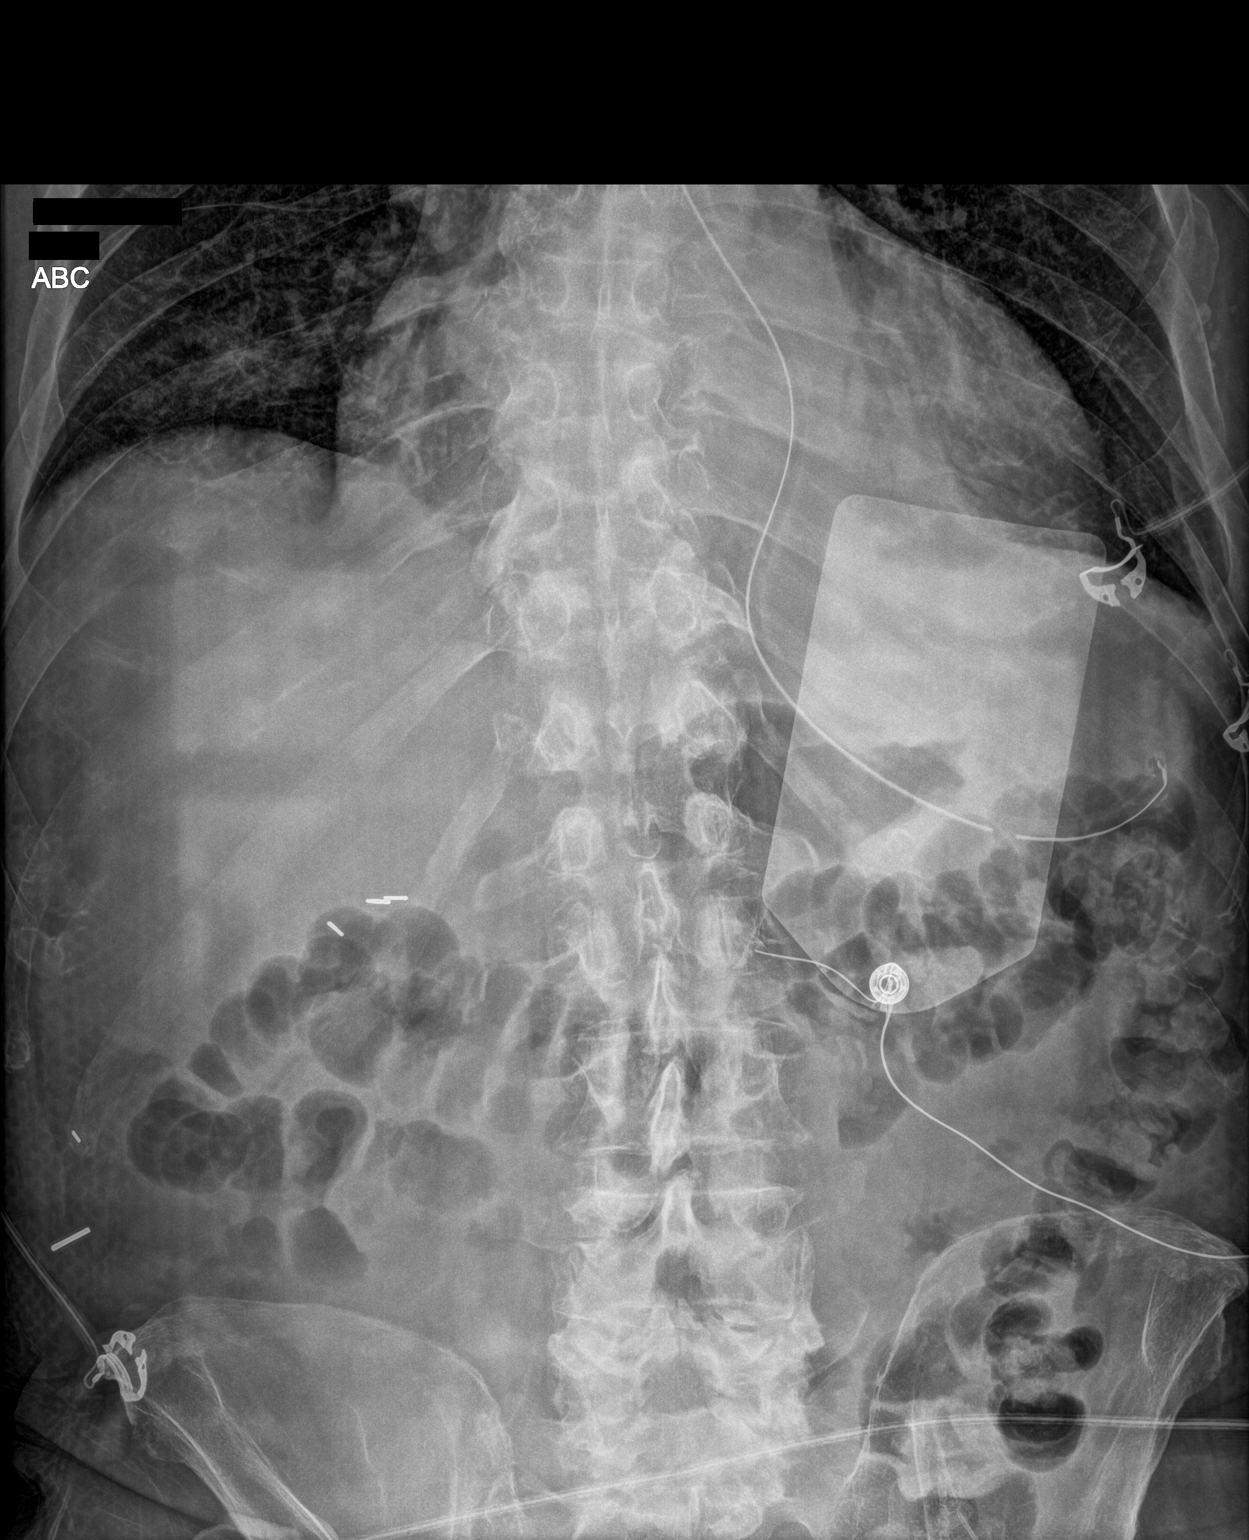

[1 of 1 positions shown; findings below may reference images not displayed]

FINDINGS: Gastric catheter has now reached the stomach. Scattered large and
small bowel gas is noted.
IMPRESSION: Gastric catheter within the stomach.
